# Patient Record
Sex: Male | Born: 1980 | Race: Black or African American | Hispanic: No | Marital: Single | Smoking: Current every day smoker
Health system: Southern US, Community
[De-identification: ages and names within clinical notes are randomized; demographics above are authoritative.]

## PROBLEM LIST (undated history)

## (undated) DIAGNOSIS — N419 Inflammatory disease of prostate, unspecified: Secondary | ICD-10-CM

## (undated) DIAGNOSIS — K4090 Unilateral inguinal hernia, without obstruction or gangrene, not specified as recurrent: Secondary | ICD-10-CM

## (undated) DIAGNOSIS — I10 Essential (primary) hypertension: Secondary | ICD-10-CM

## (undated) DIAGNOSIS — K219 Gastro-esophageal reflux disease without esophagitis: Secondary | ICD-10-CM

---
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
## 1999-02-02 ENCOUNTER — Encounter: Admission: RE | Admit: 1999-02-02 | Discharge: 1999-02-02 | Payer: Self-pay | Admitting: Internal Medicine

## 1999-02-18 ENCOUNTER — Ambulatory Visit (HOSPITAL_COMMUNITY): Admission: RE | Admit: 1999-02-18 | Discharge: 1999-02-18 | Payer: Self-pay | Admitting: General Surgery

## 2001-10-01 ENCOUNTER — Encounter: Payer: Self-pay | Admitting: Emergency Medicine

## 2001-10-01 ENCOUNTER — Emergency Department (HOSPITAL_COMMUNITY): Admission: EM | Admit: 2001-10-01 | Discharge: 2001-10-01 | Payer: Self-pay | Admitting: Emergency Medicine

## 2004-09-13 ENCOUNTER — Emergency Department (HOSPITAL_COMMUNITY): Admission: EM | Admit: 2004-09-13 | Discharge: 2004-09-13 | Payer: Self-pay | Admitting: Emergency Medicine

## 2004-09-15 ENCOUNTER — Emergency Department (HOSPITAL_COMMUNITY): Admission: EM | Admit: 2004-09-15 | Discharge: 2004-09-15 | Payer: Self-pay | Admitting: Emergency Medicine

## 2005-04-13 ENCOUNTER — Emergency Department (HOSPITAL_COMMUNITY): Admission: EM | Admit: 2005-04-13 | Discharge: 2005-04-13 | Payer: Self-pay | Admitting: Emergency Medicine

## 2005-11-02 ENCOUNTER — Ambulatory Visit (HOSPITAL_COMMUNITY): Admission: RE | Admit: 2005-11-02 | Discharge: 2005-11-02 | Payer: Self-pay | Admitting: Family Medicine

## 2005-11-02 ENCOUNTER — Emergency Department (HOSPITAL_COMMUNITY): Admission: EM | Admit: 2005-11-02 | Discharge: 2005-11-02 | Payer: Self-pay | Admitting: Family Medicine

## 2008-09-09 ENCOUNTER — Emergency Department (HOSPITAL_COMMUNITY): Admission: EM | Admit: 2008-09-09 | Discharge: 2008-09-09 | Payer: Self-pay | Admitting: Emergency Medicine

## 2008-09-12 ENCOUNTER — Encounter: Admission: RE | Admit: 2008-09-12 | Discharge: 2008-09-12 | Payer: Self-pay | Admitting: Chiropractic Medicine

## 2010-02-22 ENCOUNTER — Emergency Department (HOSPITAL_BASED_OUTPATIENT_CLINIC_OR_DEPARTMENT_OTHER): Admission: EM | Admit: 2010-02-22 | Discharge: 2010-02-22 | Payer: Self-pay | Admitting: Emergency Medicine

## 2010-09-19 ENCOUNTER — Emergency Department (HOSPITAL_COMMUNITY): Admission: EM | Admit: 2010-09-19 | Discharge: 2010-09-19 | Payer: Self-pay

## 2011-02-10 LAB — URINALYSIS, ROUTINE W REFLEX MICROSCOPIC
Ketones, ur: NEGATIVE mg/dL
Nitrite: NEGATIVE
Protein, ur: NEGATIVE mg/dL

## 2011-02-10 LAB — URINE MICROSCOPIC-ADD ON

## 2012-12-03 ENCOUNTER — Emergency Department (HOSPITAL_COMMUNITY): Payer: No Typology Code available for payment source

## 2012-12-03 ENCOUNTER — Emergency Department (HOSPITAL_COMMUNITY)
Admission: EM | Admit: 2012-12-03 | Discharge: 2012-12-04 | Disposition: A | Discharge status: Home / Self Care | Payer: No Typology Code available for payment source | Attending: Emergency Medicine | Admitting: Emergency Medicine

## 2012-12-03 ENCOUNTER — Encounter (HOSPITAL_COMMUNITY): Payer: Self-pay | Admitting: Emergency Medicine

## 2012-12-03 DIAGNOSIS — S8990XA Unspecified injury of unspecified lower leg, initial encounter: Secondary | ICD-10-CM | POA: Insufficient documentation

## 2012-12-03 DIAGNOSIS — F17200 Nicotine dependence, unspecified, uncomplicated: Secondary | ICD-10-CM | POA: Insufficient documentation

## 2012-12-03 DIAGNOSIS — S0990XA Unspecified injury of head, initial encounter: Secondary | ICD-10-CM | POA: Insufficient documentation

## 2012-12-03 DIAGNOSIS — R51 Headache: Secondary | ICD-10-CM | POA: Insufficient documentation

## 2012-12-03 DIAGNOSIS — S0993XA Unspecified injury of face, initial encounter: Secondary | ICD-10-CM | POA: Insufficient documentation

## 2012-12-03 DIAGNOSIS — S82401A Unspecified fracture of shaft of right fibula, initial encounter for closed fracture: Secondary | ICD-10-CM

## 2012-12-03 DIAGNOSIS — S82409A Unspecified fracture of shaft of unspecified fibula, initial encounter for closed fracture: Secondary | ICD-10-CM | POA: Insufficient documentation

## 2012-12-03 DIAGNOSIS — H5710 Ocular pain, unspecified eye: Secondary | ICD-10-CM | POA: Insufficient documentation

## 2012-12-03 DIAGNOSIS — S99919A Unspecified injury of unspecified ankle, initial encounter: Secondary | ICD-10-CM | POA: Insufficient documentation

## 2012-12-03 DIAGNOSIS — Y9389 Activity, other specified: Secondary | ICD-10-CM | POA: Insufficient documentation

## 2012-12-03 DIAGNOSIS — Y92410 Unspecified street and highway as the place of occurrence of the external cause: Secondary | ICD-10-CM | POA: Insufficient documentation

## 2012-12-03 MED ORDER — HYDROCODONE-ACETAMINOPHEN 5-325 MG PO TABS
1.0000 | ORAL_TABLET | Freq: Once | ORAL | Status: AC
  Administered 2012-12-03: 1 via ORAL
  Filled 2012-12-03: qty 1

## 2012-12-03 MED ORDER — IBUPROFEN 800 MG PO TABS: 800.0000 mg | ORAL_TABLET | Freq: Three times a day (TID) | ORAL | Status: DC

## 2012-12-03 MED ORDER — HYDROCODONE-ACETAMINOPHEN 5-325 MG PO TABS: 1.0000 | ORAL_TABLET | ORAL | Status: DC | PRN
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
## 2012-12-03 NOTE — ED Notes (Signed)
Bed:WA06<BR> Expected date:<BR> Expected time:<BR> Means of arrival:<BR> Comments:<BR> EMS
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
## 2012-12-04 NOTE — ED Provider Notes (Signed)
Medical screening examination/treatment/procedure(s) were performed by non-physician practitioner and as supervising physician I was immediately available for consultation/collaboration.  Layten Aiken, MD 12/04/12 0021 

## 2012-12-05 NOTE — ED Notes (Signed)
Pt called wanting work note from 12/03/12 ED visit.  Chart reviewed by C. Lawyer PA.  May give work note for 3 days w/f/u as ordered.  Pt informed and work note faxed to [employer] [phone] Attn Lawrence Santiago

## 2013-06-02 ENCOUNTER — Encounter (HOSPITAL_COMMUNITY): Payer: Self-pay | Admitting: Emergency Medicine

## 2013-06-02 ENCOUNTER — Emergency Department (HOSPITAL_COMMUNITY)
Admission: EM | Admit: 2013-06-02 | Discharge: 2013-06-02 | Disposition: A | Discharge status: Home / Self Care | Payer: Managed Care, Other (non HMO) | Attending: Emergency Medicine | Admitting: Emergency Medicine

## 2013-06-02 DIAGNOSIS — K089 Disorder of teeth and supporting structures, unspecified: Secondary | ICD-10-CM | POA: Insufficient documentation

## 2013-06-02 DIAGNOSIS — R51 Headache: Secondary | ICD-10-CM | POA: Insufficient documentation

## 2013-06-02 DIAGNOSIS — F17200 Nicotine dependence, unspecified, uncomplicated: Secondary | ICD-10-CM | POA: Insufficient documentation

## 2013-06-02 MED ORDER — SODIUM CHLORIDE 0.9 % IV BOLUS (SEPSIS)
1000.0000 mL | Freq: Once | INTRAVENOUS | Status: AC
  Administered 2013-06-02: 1000 mL via INTRAVENOUS

## 2013-06-02 MED ORDER — DIPHENHYDRAMINE HCL 50 MG/ML IJ SOLN
25.0000 mg | Freq: Once | INTRAMUSCULAR | Status: AC
  Administered 2013-06-02: 25 mg via INTRAVENOUS
  Filled 2013-06-02: qty 1

## 2013-06-02 MED ORDER — KETOROLAC TROMETHAMINE 30 MG/ML IJ SOLN
30.0000 mg | Freq: Once | INTRAMUSCULAR | Status: AC
  Administered 2013-06-02: 30 mg via INTRAVENOUS
  Filled 2013-06-02: qty 1

## 2013-06-02 MED ORDER — PROCHLORPERAZINE EDISYLATE 5 MG/ML IJ SOLN
10.0000 mg | Freq: Once | INTRAMUSCULAR | Status: AC
  Administered 2013-06-02: 10 mg via INTRAVENOUS
  Filled 2013-06-02: qty 2

## 2013-06-02 NOTE — ED Provider Notes (Signed)
Medical screening examination/treatment/procedure(s) were performed by non-physician practitioner and as supervising physician I was immediately available for consultation/collaboration.  Jones Skene, M.D.     Jones Skene, MD 06/02/13 [phone]

## 2013-06-02 NOTE — ED Notes (Signed)
Patient feeling restless, pacing in the room.  MD notified.  New orders per Dr Rulon Abide.

## 2013-06-02 NOTE — ED Notes (Signed)
Patient with headache since yesterday evening.  Patient denies any nausea or vomiting.  Patient states that he is photophobic and sensitive to sounds.  Patient has taken otc APAP with no relief.

## 2013-06-02 NOTE — ED Provider Notes (Signed)
History    CSN: [id number] Arrival date & time 06/02/13  0602  First MD Initiated Contact with Patient 06/02/13 [phone]     Chief Complaint  Patient presents with  . Headache   (Consider location/radiation/quality/duration/timing/severity/associated sxs/prior Treatment) HPI Comments: 32 y.o. Male with no significant PMHx presents today complaining of gradual onset frontal headache that started at work about 8pm last night. 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 States that headache worsened about 9pm at which time he took 3 Tylenol with no improvement. Describes the pain as 10/10, throbbing across his forehead. Constant. Worsening. Localized. Worsened with bright lights. Better when laying down with lights out. Pt took 3 more Tylenol at 11pm last night with no relief. Admits photophobia and right upper dental pain. Denies trauma, fever, nausea, vomiting, weakness, other visual disturbances, chest pain, shortness of breath, diaphoresis, cough, recent illness, neck stiffness.   Pt does not have a PCP, but does have [employer].   Patient is a 32 y.o. male presenting with headaches.  Headache Associated symptoms: photophobia   Associated symptoms: no diarrhea, no dizziness, no pain, no fever, no nausea, no neck pain, no neck stiffness, no numbness and no vomiting    History reviewed. No pertinent past medical history. Past Surgical History  Procedure Laterality Date  . Hernia repair  2000   No family history on file. History  Substance Use Topics  . Smoking status: Current Every Day Smoker -- 0.50 packs/day for 10 years    Types: Cigarettes  . Smokeless tobacco: Never Used  . Alcohol Use: Yes     Comment: occasionally    Review of Systems  Constitutional: Negative for fever and diaphoresis.  HENT: Positive for dental problem. Negative for rhinorrhea, neck pain and neck stiffness.        Upper right dental pain.   Eyes: Positive for photophobia. Negative for pain and visual  disturbance.  Respiratory: Negative for apnea, chest tightness and shortness of breath.   Cardiovascular: Negative for chest pain and palpitations.  Gastrointestinal: Negative for nausea, vomiting, diarrhea and constipation.  Genitourinary: Negative for dysuria.  Musculoskeletal: Negative for gait problem.  Skin: Negative for rash.  Neurological: Positive for headaches. Negative for dizziness, weakness, light-headedness and numbness.    Allergies  Review of patient's allergies indicates no known allergies.  Home Medications   Current Outpatient Rx  Name  Route  Sig  Dispense  Refill  . acetaminophen (TYLENOL) 500 MG tablet   Oral   Take 1,000 mg by mouth every 6 (six) hours as needed (headache).          BP 134/91  Pulse 62  Temp(Src) 98.1 F (36.7 C) (Oral)  Resp 15  SpO2 98% Physical Exam  Nursing note and vitals reviewed. Constitutional: He is oriented to person, place, and time. He appears well-developed and well-nourished. No distress.  uncomfortable  HENT:  Head: Normocephalic and atraumatic. No trismus in the jaw.  Right Ear: Tympanic membrane and external ear normal.  Left Ear: Tympanic membrane and external ear normal.  Mouth/Throat: Normal dentition. No dental abscesses or dental caries. No oropharyngeal exudate, posterior oropharyngeal edema, posterior oropharyngeal erythema or tonsillar abscesses.    Oropharynx is clear. Good dentition. Mild tenderness at teeth 1, 2, and 3 (pt states hurts in that general area, could not be specific as I touched each tooth). No erythema or edema of the underlying gingiva to suggest infection. Neck is nontender and supple without adenopathy or JVD.  Eyes: Conjunctivae  and EOM are normal. Pupils are equal, round, and reactive to light.  Neck: Normal range of motion. Neck supple.  No meningeal signs  Cardiovascular: Normal rate, regular rhythm and normal heart sounds.  Exam reveals no gallop and no friction rub.   No murmur  heard. Pulmonary/Chest: Effort normal and breath sounds normal. No respiratory distress. He has no wheezes. He has no rales. He exhibits no tenderness.  Abdominal: Soft. Bowel sounds are normal. He exhibits no distension. There is no tenderness. There is no rebound and no guarding.  Musculoskeletal: Normal range of motion. He exhibits no edema and no tenderness.  FROM to upper and lower extremities No step-offs noted on C-spine No tenderness to palpation of the spinous processes of the C-spine, T-spine or L-spine Full range of motion of C-spine, T-spine or L-spine Mild tenderness to palpation of the paraspinous muscles   Neurological: He is alert and oriented to person, place, and time. No cranial nerve deficit.  Speech is clear and goal oriented, follows commands Sensation normal to light touch and two point discrimination Moves extremities without ataxia, coordination intact Normal gait and balance Normal strength in upper and lower extremities bilaterally including dorsiflexion and plantar flexion, strong and equal grip strength   Skin: Skin is warm and dry. He is not diaphoretic. No erythema.  Psychiatric: He has a normal mood and affect.    ED Course  Procedures (including critical care time) Labs Reviewed - No data to display No results found. 1. Headache     MDM  Pt is afebrile and in NAD. Vitals WNL. Pt describes what sounds like a migraine HA. Normal neuro exam. FROM of neck and all extremities. Discussed case with Dr. Rulon Abide who agrees with trial of HA cocktail to include fluids, Toradol, benadryl, and compazine and re-evaluate. No lab work at this time.   Discussion included the fact that pt does not have a PCP, but does have [employer]. Encouraged him to establish a relationship with a PCP and also to have his dental pain looked at by a dentist. Will give both resources at discharge.   7:22 AM Pt feeling much better after headache cocktail and is ready for discharge.  Follow up to include as discussed above.  At this time there does not appear to be any evidence of an acute emergency medical condition and the patient appears stable for discharge with appropriate outpatient follow up. Diagnosis was discussed with patient who verbalizes understanding and is agreeable to discharge. Pt case discussed with Dr. Rulon Abide who agrees with plan.      Glade Nurse, PA-C 06/02/13 [phone]

## 2013-06-09 ENCOUNTER — Emergency Department (HOSPITAL_COMMUNITY)
Admission: EM | Admit: 2013-06-09 | Discharge: 2013-06-09 | Disposition: A | Discharge status: Home / Self Care | Payer: Managed Care, Other (non HMO) | Attending: Emergency Medicine | Admitting: Emergency Medicine

## 2013-06-09 ENCOUNTER — Encounter (HOSPITAL_COMMUNITY): Payer: Self-pay | Admitting: Emergency Medicine

## 2013-06-09 DIAGNOSIS — Z23 Encounter for immunization: Secondary | ICD-10-CM | POA: Insufficient documentation

## 2013-06-09 DIAGNOSIS — W268XXA Contact with other sharp object(s), not elsewhere classified, initial encounter: Secondary | ICD-10-CM | POA: Insufficient documentation

## 2013-06-09 DIAGNOSIS — Z79899 Other long term (current) drug therapy: Secondary | ICD-10-CM | POA: Insufficient documentation

## 2013-06-09 DIAGNOSIS — F17200 Nicotine dependence, unspecified, uncomplicated: Secondary | ICD-10-CM | POA: Insufficient documentation

## 2013-06-09 DIAGNOSIS — Y93E9 Activity, other interior property and clothing maintenance: Secondary | ICD-10-CM | POA: Insufficient documentation

## 2013-06-09 DIAGNOSIS — Y92009 Unspecified place in unspecified non-institutional (private) residence as the place of occurrence of the external cause: Secondary | ICD-10-CM | POA: Insufficient documentation

## 2013-06-09 DIAGNOSIS — S61219A Laceration without foreign body of unspecified finger without damage to nail, initial encounter: Secondary | ICD-10-CM

## 2013-06-09 DIAGNOSIS — S61209A Unspecified open wound of unspecified finger without damage to nail, initial encounter: Secondary | ICD-10-CM | POA: Insufficient documentation

## 2013-06-09 MED ORDER — TETANUS-DIPHTH-ACELL PERTUSSIS 5-2.5-18.5 LF-MCG/0.5 IM SUSP
0.5000 mL | Freq: Once | INTRAMUSCULAR | Status: AC
  Administered 2013-06-09: 0.5 mL via INTRAMUSCULAR
  Filled 2013-06-09: qty 0.5

## 2013-06-09 NOTE — ED Notes (Signed)
C/o approx 2 cm laceration to R 5th digit from "metal casing" approx 2-3 hours ago.  Unknown last DT.  Bandaid in place on arrival.

## 2013-06-09 NOTE — ED Provider Notes (Signed)
   History    CSN: [id number] Arrival date & time 06/09/13  0018  First MD Initiated Contact with Patient 06/09/13 0038     Chief Complaint  Patient presents with  . Extremity Laceration   HPI  History provided by the patient. Patient is 32 year old male with no significant PMH presenting with small laceration to the right fifth finger. Patient was cleaning around the oven and cut finger on a sharp piece of metal. Denies any weakness or numbness. Small amount of bleeding. No other aggravating or relieving factors. No other associated symptoms. Unsure of last tetanus.    History reviewed. No pertinent past medical history. Past Surgical History  Procedure Laterality Date  . Hernia repair  2000   No family history on file. History  Substance Use Topics  . Smoking status: Current Every Day Smoker -- 0.50 packs/day for 10 years    Types: Cigarettes  . Smokeless tobacco: Never Used  . Alcohol Use: Yes     Comment: occasionally    Review of Systems  Neurological: Negative for weakness and numbness.  All other systems reviewed and are negative.    Allergies  Review of patient's allergies indicates no known allergies.  Home Medications   Current Outpatient Rx  Name  Route  Sig  Dispense  Refill  . acetaminophen (TYLENOL) 500 MG tablet   Oral   Take 1,000 mg by mouth every 6 (six) hours as needed (headache).         . naproxen sodium (ANAPROX) 220 MG tablet   Oral   Take 220 mg by mouth 2 (two) times daily with a meal.          BP 131/80  Pulse 87  Temp(Src) 98.3 F (36.8 C) (Oral)  Resp 18  SpO2 98% Physical Exam  Nursing note and vitals reviewed. Constitutional: He is oriented to person, place, and time. He appears well-developed and well-nourished. No distress.  HENT:  Head: Normocephalic.  Cardiovascular: Normal rate and regular rhythm.   Pulmonary/Chest: Effort normal and breath sounds normal.  Musculoskeletal:  Small superficial semicircular  laceration to the distal right fifth finger. Normal sensation to light touch to the tip. Normal cap refill less than 2 seconds. Full ROM.  Neurological: He is alert and oriented to person, place, and time.  Skin: Skin is warm.  Psychiatric: He has a normal mood and affect. His behavior is normal.    ED Course  Procedures   1. Laceration of finger of right hand, initial encounter     MDM  Patient seen and evaluated. Patient well-appearing no acute distress. Tetanus updated.    Angus Seller, PA-C 06/09/13 [phone]

## 2013-06-09 NOTE — ED Provider Notes (Signed)
Medical screening examination/treatment/procedure(s) were performed by non-physician practitioner and as supervising physician I was immediately available for consultation/collaboration.   Iver Miklas, MD 06/09/13 0621 

## 2013-07-10 ENCOUNTER — Emergency Department (HOSPITAL_BASED_OUTPATIENT_CLINIC_OR_DEPARTMENT_OTHER)
Admission: EM | Admit: 2013-07-10 | Discharge: 2013-07-11 | Disposition: A | Discharge status: Home / Self Care | Payer: Managed Care, Other (non HMO) | Attending: Emergency Medicine | Admitting: Emergency Medicine

## 2013-07-10 ENCOUNTER — Encounter (HOSPITAL_BASED_OUTPATIENT_CLINIC_OR_DEPARTMENT_OTHER): Payer: Self-pay | Admitting: *Deleted

## 2013-07-10 DIAGNOSIS — F17200 Nicotine dependence, unspecified, uncomplicated: Secondary | ICD-10-CM | POA: Insufficient documentation

## 2013-07-10 DIAGNOSIS — N342 Other urethritis: Secondary | ICD-10-CM | POA: Insufficient documentation

## 2013-07-10 NOTE — ED Notes (Signed)
Pt c/o penile discharge  X 1 day

## 2013-07-11 MED ORDER — AZITHROMYCIN 1 G PO PACK
1.0000 g | PACK | Freq: Once | ORAL | Status: AC
  Administered 2013-07-11: 1 g via ORAL
  Filled 2013-07-11: qty 1

## 2013-07-11 MED ORDER — CEFTRIAXONE SODIUM 250 MG IJ SOLR
250.0000 mg | Freq: Once | INTRAMUSCULAR | Status: AC
  Administered 2013-07-11: 250 mg via INTRAMUSCULAR
  Filled 2013-07-11: qty 250
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
## 2013-07-12 LAB — GC/CHLAMYDIA PROBE AMP
CT Probe RNA: NEGATIVE
GC Probe RNA: NEGATIVE

## 2014-04-25 ENCOUNTER — Emergency Department (HOSPITAL_COMMUNITY)
Admission: EM | Admit: 2014-04-25 | Discharge: 2014-04-25 | Disposition: A | Discharge status: Home / Self Care | Payer: 59 | Attending: Emergency Medicine | Admitting: Emergency Medicine

## 2014-04-25 ENCOUNTER — Encounter (HOSPITAL_COMMUNITY): Payer: Self-pay | Admitting: Emergency Medicine

## 2014-04-25 DIAGNOSIS — Z79899 Other long term (current) drug therapy: Secondary | ICD-10-CM | POA: Insufficient documentation

## 2014-04-25 DIAGNOSIS — R509 Fever, unspecified: Secondary | ICD-10-CM | POA: Insufficient documentation

## 2014-04-25 DIAGNOSIS — F17200 Nicotine dependence, unspecified, uncomplicated: Secondary | ICD-10-CM | POA: Insufficient documentation

## 2014-04-25 DIAGNOSIS — R197 Diarrhea, unspecified: Secondary | ICD-10-CM | POA: Insufficient documentation

## 2014-04-25 LAB — COMPREHENSIVE METABOLIC PANEL
ALBUMIN: 3.8 g/dL (ref 3.5–5.2)
ALT: 23 U/L (ref 0–53)
AST: 29 U/L (ref 0–37)
Alkaline Phosphatase: 71 U/L (ref 39–117)
BUN: 8 mg/dL (ref 6–23)
CALCIUM: 9.4 mg/dL (ref 8.4–10.5)
CO2: 22 mEq/L (ref 19–32)
CREATININE: 0.92 mg/dL (ref 0.50–1.35)
Chloride: 101 mEq/L (ref 96–112)
GFR calc Af Amer: >90 mL/min (ref >90)
Glucose, Bld: 97 mg/dL (ref 70–99)
Potassium: 3.5 mEq/L — ABNORMAL LOW (ref 3.7–5.3)
Sodium: 137 mEq/L (ref 137–147)
Total Bilirubin: 0.5 mg/dL (ref 0.3–1.2)
Total Protein: 7.8 g/dL (ref 6.0–8.3)

## 2014-04-25 LAB — CLOSTRIDIUM DIFFICILE BY PCR: CDIFFPCR: NEGATIVE

## 2014-04-25 LAB — CBC
HEMATOCRIT: 39.9 % (ref 39.0–52.0)
Hemoglobin: 14.4 g/dL (ref 13.0–17.0)
MCH: 30.5 pg (ref 26.0–34.0)
MCHC: 36.1 g/dL — ABNORMAL HIGH (ref 30.0–36.0)
MCV: 84.5 fL (ref 78.0–100.0)
PLATELETS: 154 10*3/uL (ref 150–400)
RBC: 4.72 MIL/uL (ref 4.22–5.81)
RDW: 12.5 % (ref 11.5–15.5)
WBC: 4.8 10*3/uL (ref 4.0–10.5)

## 2014-04-25 MED ORDER — LOPERAMIDE HCL 2 MG PO CAPS: 2.0000 mg | ORAL_CAPSULE | Freq: Four times a day (QID) | ORAL | Status: DC | PRN

## 2014-04-25 MED ORDER — SODIUM CHLORIDE 0.9 % IV BOLUS (SEPSIS)
1000.0000 mL | Freq: Once | INTRAVENOUS | Status: AC
  Administered 2014-04-25: 1000 mL via INTRAVENOUS

## 2014-04-25 NOTE — ED Notes (Signed)
Pt to bathroom via w/c -- liquid greenish stool sent to lab,

## 2014-04-25 NOTE — ED Notes (Signed)
Pt presents with 1 week h/o intermittent fever.  Pt reports fever of 102 this morning at 0300.  Pt reports intermittent, generalized abdominal pain and diarrhea.  Pt denies nausea, vomiting, reports decreased appetite; pt denies any sick contact.

## 2014-04-25 NOTE — Discharge Instructions (Signed)
Take Imodium as prescribed as needed for diarrhea. Stay well-hydrated. You were tested today for bacteria in your stool, if this test results positive, you will be informed.  Diarrhea Diarrhea is frequent loose and watery bowel movements. It can cause you to feel weak and dehydrated. Dehydration can cause you to become tired and thirsty, have a dry mouth, and have decreased urination that often is dark yellow. Diarrhea is a sign of another problem, most often an infection that will not last long. In most cases, diarrhea typically lasts 2 3 days. However, it can last longer if it is a sign of something more serious. It is important to treat your diarrhea as directed by your caregive to lessen or prevent future episodes of diarrhea. CAUSES  Some common causes include:  Gastrointestinal infections caused by viruses, bacteria, or parasites.  Food poisoning or food allergies.  Certain medicines, such as antibiotics, chemotherapy, and laxatives.  Artificial sweeteners and fructose.  Digestive disorders. HOME CARE INSTRUCTIONS  Ensure adequate fluid intake (hydration): have 1 cup (8 oz) of fluid for each diarrhea episode. Avoid fluids that contain simple sugars or sports drinks, fruit juices, whole milk products, and sodas. Your urine should be clear or pale yellow if you are drinking enough fluids. Hydrate with an oral rehydration solution that you can purchase at pharmacies, retail stores, and online. You can prepare an oral rehydration solution at home by mixing the following ingredients together:    tsp table salt.   tsp baking soda.   tsp salt substitute containing potassium chloride.  1  tablespoons sugar.  1 L (34 oz) of water.  Certain foods and beverages may increase the speed at which food moves through the gastrointestinal (GI) tract. These foods and beverages should be avoided and include:  Caffeinated and alcoholic beverages.  High-fiber foods, such as raw fruits and  vegetables, nuts, seeds, and whole grain breads and cereals.  Foods and beverages sweetened with sugar alcohols, such as xylitol, sorbitol, and mannitol.  Some foods may be well tolerated and may help thicken stool including:  Starchy foods, such as rice, toast, pasta, low-sugar cereal, oatmeal, grits, baked potatoes, crackers, and bagels.  Bananas.  Applesauce.  Add probiotic-rich foods to help increase healthy bacteria in the GI tract, such as yogurt and fermented milk products.  Wash your hands well after each diarrhea episode.  Only take over-the-counter or prescription medicines as directed by your caregiver.  Take a warm bath to relieve any burning or pain from frequent diarrhea episodes. SEEK IMMEDIATE MEDICAL CARE IF:   You are unable to keep fluids down.  You have persistent vomiting.  You have blood in your stool, or your stools are black and tarry.  You do not urinate in 6 8 hours, or there is only a small amount of very dark urine.  You have abdominal pain that increases or localizes.  You have weakness, dizziness, confusion, or lightheadedness.  You have a severe headache.  Your diarrhea gets worse or does not get better.  You have a fever or persistent symptoms for more than 2 3 days.  You have a fever and your symptoms suddenly get worse. MAKE SURE YOU:   Understand these instructions.  Will watch your condition.  Will get help right away if you are not doing well or get worse. Document Released: 10/29/2002 Document Revised: 10/25/2012 Document Reviewed: 07/16/2012 [facility] Patient Information 2014 [place], [place].  Fever, Adult A fever is a higher than normal body temperature. In an  adult, an oral temperature around 98.6 F (37 C) is considered normal. A temperature of 100.4 F (38 C) or higher is generally considered a fever. Mild or moderate fevers generally have no long-term effects and often do not require treatment. Extreme fever (greater than  or equal to 106 F or 41.1 C) can cause seizures. The sweating that may occur with repeated or prolonged fever may cause dehydration. Elderly people can develop confusion during a fever. A measured temperature can vary with:  Age.  Time of day.  Method of measurement (mouth, underarm, rectal, or ear). The fever is confirmed by taking a temperature with a thermometer. Temperatures can be taken different ways. Some methods are accurate and some are not.  An oral temperature is used most commonly. Electronic thermometers are fast and accurate.  An ear temperature will only be accurate if the thermometer is positioned as recommended by the manufacturer.  A rectal temperature is accurate and done for those adults who have a condition where an oral temperature cannot be taken.  An underarm (axillary) temperature is not accurate and not recommended. Fever is a symptom, not a disease.  CAUSES   Infections commonly cause fever.  Some noninfectious causes for fever include:  Some arthritis conditions.  Some thyroid or adrenal gland conditions.  Some immune system conditions.  Some types of cancer.  A medicine reaction.  High doses of certain street drugs such as methamphetamine.  Dehydration.  Exposure to high outside or room temperatures.  Occasionally, the source of a fever cannot be determined. This is sometimes called a "fever of unknown origin" (FUO).  Some situations may lead to a temporary rise in body temperature that may go away on its own. Examples are:  Childbirth.  Surgery.  Intense exercise. HOME CARE INSTRUCTIONS   Take appropriate medicines for fever. Follow dosing instructions carefully. If you use acetaminophen to reduce the fever, be careful to avoid taking other medicines that also contain acetaminophen. Do not take aspirin for a fever if you are younger than age 39. There is an association with Reye's syndrome. Reye's syndrome is a rare but potentially  deadly disease.  If an infection is present and antibiotics have been prescribed, take them as directed. Finish them even if you start to feel better.  Rest as needed.  Maintain an adequate fluid intake. To prevent dehydration during an illness with prolonged or recurrent fever, you may need to drink extra fluid.Drink enough fluids to keep your urine clear or pale yellow.  Sponging or bathing with room temperature water may help reduce body temperature. Do not use ice water or alcohol sponge baths.  Dress comfortably, but do not over-bundle. SEEK MEDICAL CARE IF:   You are unable to keep fluids down.  You develop vomiting or diarrhea.  You are not feeling at least partly better after 3 days.  You develop new symptoms or problems. SEEK IMMEDIATE MEDICAL CARE IF:   You have shortness of breath or trouble breathing.  You develop excessive weakness.  You are dizzy or you faint.  You are extremely thirsty or you are making little or no urine.  You develop new pain that was not there before (such as in the head, neck, chest, back, or abdomen).  You have persistant vomiting and diarrhea for more than 1 to 2 days.  You develop a stiff neck or your eyes become sensitive to light.  You develop a skin rash.  You have a fever or persistent symptoms for more  than 2 to 3 days.  You have a fever and your symptoms suddenly get worse. MAKE SURE YOU:   Understand these instructions.  Will watch your condition.  Will get help right away if you are not doing well or get worse. Document Released: 05/04/2001 Document Revised: 01/31/2012 Document Reviewed: 09/09/2011 [facility] Patient Information 2014 [place].

## 2014-04-25 NOTE — ED Notes (Signed)
Pt states has been tired starting last week, then started having diarrhea and fever symptoms afterwards. Denies any risky behaviors- states had a fever at 3am that was 102.

## 2014-04-25 NOTE — ED Notes (Signed)
PA student at bedside.

## 2014-04-25 NOTE — ED Provider Notes (Signed)
Medical screening examination/treatment/procedure(s) were performed by non-physician practitioner and as supervising physician I was immediately available for consultation/collaboration.   EKG Interpretation None        Amandy Chubbuck N Denece Shearer, DO 04/25/14 1538 

## 2014-04-25 NOTE — ED Provider Notes (Signed)
CSN: [id number]     Arrival date & time 04/25/14  1149 History   First MD Initiated Contact with Patient 04/25/14 1202     No chief complaint on file.    (Consider location/radiation/quality/duration/timing/severity/associated sxs/prior Treatment) HPI Comments: 33 year old healthy male presents to the emergency department complaining of intermittent fever x1 week. Patient states he did not check his temperature until 2 days ago when he noticed he had a fever of 102. He has been taking over-the-counter cold and flu medicine with temporary relief of fever. This morning around 3:00 AM he had a temperature of 102, took the over-the-counter medication at that time, he has not had any medication since. Admits to associated diarrhea, he reports 1-2 episodes over the past 2 days. Stool is brown in color, not watery and does not have a foul odor. Denies abdominal pain, nausea or vomiting. He has a decreased appetite. States he has been in and out of the hospital with his mom who was released from the ICU one week ago and is now in a rehabilitation facility. Denies any recent antibiotic use.  The history is provided by the patient.    History reviewed. No pertinent past medical history. Past Surgical History  Procedure Laterality Date  . Hernia repair  2000   History reviewed. No pertinent family history. History  Substance Use Topics  . Smoking status: Current Every Day Smoker -- 0.50 packs/day for 10 years    Types: Cigarettes  . Smokeless tobacco: Never Used  . Alcohol Use: Yes     Comment: occasionally    Review of Systems  Constitutional: Positive for fever.  Gastrointestinal: Positive for diarrhea.  All other systems reviewed and are negative.     Allergies  Review of patient's allergies indicates no known allergies.  Home Medications   Prior to Admission medications   Medication Sig Start Date End Date Taking? Authorizing Provider  naproxen sodium (ANAPROX) 220 MG tablet Take  220 mg by mouth 2 (two) times daily with a meal.   Yes Historical Provider, MD  Nutritional Supplements (COLD AND FLU PO) Take 10 mLs by mouth 2 (two) times daily as needed (for cold symptoms).   Yes Historical Provider, MD  loperamide (IMODIUM) 2 MG capsule Take 1 capsule (2 mg total) by mouth 4 (four) times daily as needed for diarrhea or loose stools. 04/25/14   Trevor Mace, PA-C   BP 139/70  Pulse 73  Temp(Src) 98.5 F (36.9 C) (Oral)  Resp 12  Ht 6\' 4"  (1.93 m)  Wt 179 lb (81.194 kg)  BMI 21.80 kg/m2  SpO2 100% Physical Exam  Nursing note and vitals reviewed. Constitutional: He is oriented to person, place, and time. He appears well-developed and well-nourished. No distress.  HENT:  Head: Normocephalic and atraumatic.  Mouth/Throat: Oropharynx is clear and moist.  Eyes: Conjunctivae are normal. No scleral icterus.  Neck: Normal range of motion. Neck supple.  No meningeal signs.  Cardiovascular: Normal rate, regular rhythm, normal heart sounds and intact distal pulses.   Pulmonary/Chest: Effort normal and breath sounds normal.  Abdominal: Soft. Bowel sounds are normal. He exhibits no distension and no mass. There is no tenderness. There is no rebound and no guarding.  Musculoskeletal: Normal range of motion. He exhibits no edema.  Neurological: He is alert and oriented to person, place, and time.  Skin: Skin is warm and dry. He is not diaphoretic.  Psychiatric: He has a normal mood and affect. His behavior is normal.  ED Course  Procedures (including critical care time) Labs Review Labs Reviewed  CBC - Abnormal; Notable for the following:    MCHC 36.1 (*)    All other components within normal limits  COMPREHENSIVE METABOLIC PANEL - Abnormal; Notable for the following:    Potassium 3.5 (*)    All other components within normal limits  STOOL CULTURE  CLOSTRIDIUM DIFFICILE BY PCR    Imaging Review No results found.   EKG Interpretation None      MDM   Final  diagnoses:  Diarrhea  Fever    Patient presenting with fever and diarrhea. He is well appearing and in no apparent distress. Afebrile, vital signs stable. He has not had any medication since 3:00 this morning. His abdomen is soft and nontender. Plan to give IV fluids, check labs and stool culture if possible. 2:54 PM Her labs without any acute finding. C. difficile and stool culture pending. Patient is resting comfortably on exam bed. Abdomen remains soft and nontender. Stable for discharge. We'll discharge with Lomotil. Return precautions given. Patient states understanding of treatment care plan and is agreeable.  Trevor MaceRobyn M Albert, PA-C 04/25/14 1455

## 2014-04-27 ENCOUNTER — Encounter (HOSPITAL_COMMUNITY): Payer: Self-pay | Admitting: Emergency Medicine

## 2014-04-27 ENCOUNTER — Emergency Department (HOSPITAL_COMMUNITY): Payer: 59

## 2014-04-27 ENCOUNTER — Emergency Department (HOSPITAL_COMMUNITY)
Admission: EM | Admit: 2014-04-27 | Discharge: 2014-04-27 | Disposition: A | Discharge status: Home / Self Care | Payer: 59 | Attending: Emergency Medicine | Admitting: Emergency Medicine

## 2014-04-27 DIAGNOSIS — R059 Cough, unspecified: Secondary | ICD-10-CM | POA: Insufficient documentation

## 2014-04-27 DIAGNOSIS — N433 Hydrocele, unspecified: Secondary | ICD-10-CM | POA: Insufficient documentation

## 2014-04-27 DIAGNOSIS — R5383 Other fatigue: Secondary | ICD-10-CM

## 2014-04-27 DIAGNOSIS — Z791 Long term (current) use of non-steroidal anti-inflammatories (NSAID): Secondary | ICD-10-CM | POA: Insufficient documentation

## 2014-04-27 DIAGNOSIS — IMO0001 Reserved for inherently not codable concepts without codable children: Secondary | ICD-10-CM | POA: Insufficient documentation

## 2014-04-27 DIAGNOSIS — R42 Dizziness and giddiness: Secondary | ICD-10-CM | POA: Insufficient documentation

## 2014-04-27 DIAGNOSIS — R509 Fever, unspecified: Secondary | ICD-10-CM

## 2014-04-27 DIAGNOSIS — R05 Cough: Secondary | ICD-10-CM | POA: Insufficient documentation

## 2014-04-27 DIAGNOSIS — R5381 Other malaise: Secondary | ICD-10-CM | POA: Insufficient documentation

## 2014-04-27 LAB — CBC WITH DIFFERENTIAL/PLATELET
BASOS ABS: 0 10*3/uL (ref 0.0–0.1)
BASOS PCT: 1 % (ref 0–1)
EOS PCT: 1 % (ref 0–5)
Eosinophils Absolute: 0 10*3/uL (ref 0.0–0.7)
HEMATOCRIT: 38 % — AB (ref 39.0–52.0)
Hemoglobin: 13.3 g/dL (ref 13.0–17.0)
Lymphocytes Relative: 37 % (ref 12–46)
Lymphs Abs: 1.6 10*3/uL (ref 0.7–4.0)
MCH: 29.2 pg (ref 26.0–34.0)
MCHC: 35 g/dL (ref 30.0–36.0)
MCV: 83.3 fL (ref 78.0–100.0)
MONO ABS: 0.4 10*3/uL (ref 0.1–1.0)
Monocytes Relative: 10 % (ref 3–12)
Neutro Abs: 2.3 10*3/uL (ref 1.7–7.7)
Neutrophils Relative %: 53 % (ref 43–77)
Platelets: 146 10*3/uL — ABNORMAL LOW (ref 150–400)
RBC: 4.56 MIL/uL (ref 4.22–5.81)
RDW: 12.4 % (ref 11.5–15.5)
WBC: 4.4 10*3/uL (ref 4.0–10.5)

## 2014-04-27 LAB — COMPREHENSIVE METABOLIC PANEL
ALBUMIN: 3.6 g/dL (ref 3.5–5.2)
ALT: 24 U/L (ref 0–53)
AST: 33 U/L (ref 0–37)
Alkaline Phosphatase: 64 U/L (ref 39–117)
BUN: 9 mg/dL (ref 6–23)
CALCIUM: 9 mg/dL (ref 8.4–10.5)
CO2: 19 mEq/L (ref 19–32)
CREATININE: 0.91 mg/dL (ref 0.50–1.35)
Chloride: 97 mEq/L (ref 96–112)
GFR calc Af Amer: >90 mL/min (ref >90)
GFR calc non Af Amer: >90 mL/min (ref >90)
Glucose, Bld: 107 mg/dL — ABNORMAL HIGH (ref 70–99)
Potassium: 3.6 mEq/L — ABNORMAL LOW (ref 3.7–5.3)
Sodium: 133 mEq/L — ABNORMAL LOW (ref 137–147)
Total Bilirubin: 0.6 mg/dL (ref 0.3–1.2)
Total Protein: 7.4 g/dL (ref 6.0–8.3)

## 2014-04-27 LAB — URINALYSIS, ROUTINE W REFLEX MICROSCOPIC
GLUCOSE, UA: NEGATIVE mg/dL
HGB URINE DIPSTICK: NEGATIVE
Ketones, ur: 15 mg/dL — AB
Leukocytes, UA: NEGATIVE
Nitrite: NEGATIVE
Protein, ur: NEGATIVE mg/dL
Specific Gravity, Urine: 1.024 (ref 1.005–1.030)
UROBILINOGEN UA: 1 mg/dL (ref 0.0–1.0)
pH: 6 (ref 5.0–8.0)

## 2014-04-27 LAB — CK: CK TOTAL: 174 U/L (ref 7–232)

## 2014-04-27 MED ORDER — SODIUM CHLORIDE 0.9 % IV BOLUS (SEPSIS): 1000.0000 mL | Freq: Once | INTRAVENOUS | Status: DC

## 2014-04-27 MED ORDER — ACETAMINOPHEN 325 MG PO TABS
650.0000 mg | ORAL_TABLET | Freq: Once | ORAL | Status: AC
  Administered 2014-04-27: 650 mg via ORAL
  Filled 2014-04-27: qty 2

## 2014-04-27 MED ORDER — IBUPROFEN 600 MG PO TABS: 600.0000 mg | ORAL_TABLET | Freq: Four times a day (QID) | ORAL | Status: DC | PRN

## 2014-04-27 NOTE — ED Notes (Signed)
He states he was here Thursday for fevers and diarrhea. His symptoms have not improved. He tried a stool softener. He states he has not been eating well and last night he felt dizzy

## 2014-04-27 NOTE — Discharge Instructions (Signed)
We saw you in the ER for the fever. All the results in the ER are normal, labs and imaging. We are not sure what is causing your symptoms. The workup in the ER is not complete, and is limited to screening for life threatening and emergent conditions only, so please see a primary care doctor for further evaluation.  RESOURCE GUIDE  Chronic Pain Problems: Contact [facility]  [phone] Patients need to be referred by their primary care doctor.  Insufficient Money for Medicine: Contact [employer] Way:  call "211."   No Primary Care Doctor: - Call Health Connect  [phone] - can help you locate a primary care doctor that  accepts your insurance, provides certain services, etc. - Physician Referral Service- [phone]  Agencies that provide inexpensive medical care: - [facility]  [phone] - [facility]  [phone] - [facility]  [phone] - Women's Clinic  [phone] - Planned Parenthood  [phone] [facility]  [phone]  Medicaid-accepting [facility] Providers: - [facility]- [address]  [phone], Mon-Fri 9am-7pm, Sat 9am-1pm - [facility]- [address] [place], [address] [place]  [phone] - [facility]- [address], [address] [place]  [phone] [facility]- [address]  [phone] - Renaye Rakers- [address] [place], [address], [phone]  Only accepts [place] patients after they have their name  applied to their card  Self Pay (no insurance) in [place]: - Sickle Cell Patients: Dr Willey Blade, [facility]  [address] [place], [phone] - [facility]- [address] [place]  [phone]       [facility] [place]- [address]       -     [facility]- see information above (Speak to [employer] if you do not have insurance)       -   [facility]- [address],  [phone]       -  [facility]- [address], [phone]       -  Dr Julio Sicks-  [address], [place], [phone]       -  [facility] - [address] [phone]       -  [facility]- [address], [phone], also [address], [phone]       -    [facility]- [address] [place], [phone], 1st & 3rd Saturday        every month, 10am-1pm  [facility] [address] [place], [place] [address] [phone]  [facility] [address] [place], [place] [address] [phone]  1) Find a Doctor and Pay Out of Pocket Although you won't have to find out who is covered by your insurance plan, it is a good idea to ask around and get recommendations. You will then need to call the office and see if the doctor you have chosen will accept you as a new patient and what types of options they offer for patients who are self-pay. Some doctors offer discounts or will set up payment plans for their patients who do not have insurance, but you will need to ask so you aren't surprised when you get  to your appointment.  2) Contact Your Local Health Department Not all health departments have doctors that can see patients for sick visits, but many do, so it is worth a call to see if yours does. If you don't know where your local health department is, you can check in your phone book. The CDC also has a tool to help you locate your state's health department, and many state websites also have listings of all of their local health departments.  3) Find a Walk-in Clinic If your illness is not likely to be very severe or complicated, you may want to try a walk in clinic. These are popping up all over the country in pharmacies, drugstores, and shopping centers. They're usually staffed by nurse practitioners or physician assistants that have been  trained to treat common illnesses and complaints. They're usually fairly quick and inexpensive. However, if you have serious medical issues or chronic medical problems, these are probably not your best option  STD Testing - [facility] [facility] [place], STD Clinic, [address], [place], phone [phone] or [phone].  Monday - Friday, call for an appointment. [facility]- [place] [facility] of [facility] [place], STD Clinic, [place], [place], phone [phone] or [phone].  Monday - Friday, call for an appointment.  Abuse/Neglect: [facility]- [place] Child Abuse Hotline [phone]) 8724533952 [facility]- [place] Child Abuse Hotline 775 660 [phone] (After Hours)  Emergency Shelter:  [facility] [phone]  Maternity Homes: - [facility] [place] [facility] [phone] - [facility] [phone]  MRSA Hotline #:   (952)568-7147(706)887-9155  Dental Assistance If unable to pay or uninsured, contact:  [facility] to become qualified for the adult dental clinic.  Patients with Medicaid: [facility] [phone], [phone] [address], [phone]  If unable to pay, or uninsured, contact [facility] [phone] in [place], [phone] in [facility]) to become qualified for the adult dental clinic  [facility] [address] [place], [place] [address] [phone] [email or web address]  Other [facility] Community Dental Services: - [facility]- [address], [place], [place], [address], [phone], [address], 2nd and 4th Thursday of the month at 6:30am.  10 clients each day by appointment, can sometimes see walk-in patients if someone does not show for an appointment. [facility]- [facility]- [address], [place], [place], [address], [phone] - [facility]- [address], [place], [place], [address], [phone] [facility]- [place]  [facility]- [phone] [facility]- [place] [facility]- [phone] [facility] [place] [phone]           Fever, Adult A fever is a higher than normal body temperature. In an adult, an oral temperature around 98.6 F (37 C) is considered normal. A temperature of 100.4 F (38 C) or higher is generally considered a fever. Mild or moderate fevers generally have no long-term effects and often do not require treatment. Extreme fever (greater than or equal to 106 F or 41.1 C) can cause seizures. The sweating that may occur with repeated or prolonged fever may cause dehydration. Elderly people can develop confusion during a fever. A measured temperature can vary with:  Age.  Time of day.  Method of measurement (mouth, underarm, rectal, or ear). The fever is confirmed by taking a temperature with a thermometer. Temperatures can be taken different ways. Some methods are accurate and some are not.  An oral temperature is used most commonly. Electronic thermometers are fast and accurate.  An ear temperature will only be accurate if the thermometer is positioned as recommended by the manufacturer.  A rectal temperature is accurate and done for those adults who have a condition where an oral temperature cannot be taken.  An underarm (axillary) temperature is not accurate and not recommended. Fever is a symptom, not a disease.  CAUSES   Infections commonly cause fever.  Some noninfectious causes for fever include:  Some arthritis conditions.  Some thyroid or adrenal gland conditions.  Some immune system conditions.  Some types of cancer.  A medicine reaction.  High doses of certain street drugs such as methamphetamine.  Dehydration.  Exposure to high outside or room temperatures.  Occasionally, the source of a fever cannot be determined. This is sometimes called a "fever of unknown origin" (FUO).  Some situations may lead to a temporary rise in  body temperature that may go away on its own. Examples are:  Childbirth.  Surgery.  Intense exercise. HOME CARE INSTRUCTIONS   Take appropriate medicines for fever. Follow dosing instructions carefully. If you use acetaminophen to reduce the fever, be careful to avoid taking other medicines that also contain acetaminophen. Do not take aspirin for a fever if you are younger than age 74. There is an association with Reye's syndrome. Reye's syndrome is a rare but potentially deadly disease.  If an infection is present and antibiotics have been prescribed, take them as directed. Finish them even if you start to feel better.  Rest as needed.  Maintain an adequate fluid intake. To prevent dehydration during an illness with prolonged or recurrent fever, you may need to drink extra fluid.Drink enough fluids to keep your urine clear or pale yellow.  Sponging or bathing with room temperature water may help reduce body temperature. Do not use ice water or alcohol sponge baths.  Dress comfortably, but do not over-bundle. SEEK MEDICAL CARE IF:   You are unable to keep fluids down.  You develop vomiting or diarrhea.  You are not feeling at least partly better after 3 days.  You develop new symptoms or problems. SEEK IMMEDIATE MEDICAL CARE IF:   You have shortness of breath or trouble breathing.  You develop excessive weakness.  You are dizzy or you faint.  You are extremely thirsty or you are making little or no urine.  You develop new pain that was not there before (such as in the head, neck, chest, back, or abdomen).  You have persistant vomiting and diarrhea for more than 1 to 2 days.  You develop a stiff neck or your eyes become sensitive to light.  You develop a skin rash.  You have a fever or persistent symptoms for more than 2 to 3 days.  You have a fever and your symptoms suddenly get worse. MAKE SURE YOU:   Understand these instructions.  Will watch your  condition.  Will get help right away if you are not doing well or get worse. Document Released: 05/04/2001 Document Revised: 01/31/2012 Document Reviewed: 09/09/2011 [facility] Patient Information 2014 [place], [place].

## 2014-04-27 NOTE — ED Provider Notes (Signed)
CSN: [id number]     Arrival date & time 04/27/14  0919 History   First MD Initiated Contact with Patient 04/27/14 [phone]     Chief Complaint  Patient presents with  . Fever     (Consider location/radiation/quality/duration/timing/severity/associated sxs/prior Treatment) HPI Comments: Pt comes in with cc of fevers. Pt has no medical hx. States that he has been busy taking care of his mother for the past few weeks, and started noticing that he has been increasingly getting tired the last few days. Pt thereafter noticed that he has been having fevers - as high as 102. He was recently seen in the ER, for fevers and diarrhea, discharged with imodium. Pt reports + cough, dry intermittent, no night sweats, no nausea, emesis, chest pains, shortness of breath, headaches, abdominal pain, uti like symptoms, rashes. Pt has no joint swelling, leg swelling, PE or DVT hx or risk factors. Pt has had 1 loose BM since last night. ROS is + for dizziness with standing. No ivda hx. No sick contacts.    Patient is a 33 y.o. male presenting with fever. The history is provided by the patient.  Fever Associated symptoms: myalgias   Associated symptoms: no chest pain, no chills, no confusion, no cough, no dysuria and no headaches     History reviewed. No pertinent past medical history. Past Surgical History  Procedure Laterality Date  . Hernia repair  2000   History reviewed. No pertinent family history. History  Substance Use Topics  . Smoking status: Current Every Day Smoker -- 0.50 packs/day for 10 years    Types: Cigarettes  . Smokeless tobacco: Never Used  . Alcohol Use: Yes     Comment: occasionally    Review of Systems  Constitutional: Positive for fever and fatigue. Negative for chills and activity change.  Eyes: Negative for visual disturbance.  Respiratory: Negative for cough, chest tightness and shortness of breath.   Cardiovascular: Negative for chest pain.  Gastrointestinal: Negative for  abdominal distention.  Genitourinary: Negative for dysuria, enuresis and difficulty urinating.  Musculoskeletal: Positive for myalgias. Negative for arthralgias and neck pain.  Neurological: Positive for dizziness and weakness. Negative for light-headedness and headaches.  Psychiatric/Behavioral: Negative for confusion.      Allergies  Review of patient's allergies indicates no known allergies.  Home Medications   Prior to Admission medications   Medication Sig Start Date End Date Taking? Authorizing Provider  ibuprofen (ADVIL,MOTRIN) 200 MG tablet Take 200-400 mg by mouth every 6 (six) hours as needed for fever, headache, mild pain, moderate pain or cramping.   Yes Historical Provider, MD  loperamide (IMODIUM) 2 MG capsule Take 1 capsule (2 mg total) by mouth 4 (four) times daily as needed for diarrhea or loose stools. 04/25/14  Yes Trevor Mace, PA-C  naproxen sodium (ANAPROX) 220 MG tablet Take 220 mg by mouth 2 (two) times daily with a meal.   Yes Historical Provider, MD  Nutritional Supplements (COLD AND FLU PO) Take 10 mLs by mouth 2 (two) times daily as needed (for cold symptoms).   Yes Historical Provider, MD  ibuprofen (ADVIL,MOTRIN) 600 MG tablet Take 1 tablet (600 mg total) by mouth every 6 (six) hours as needed for fever. 04/27/14   Macklen Wilhoite Rhunette Croft, MD   BP 113/55  Pulse 67  Temp(Src) 99.1 F (37.3 C) (Oral)  Resp 18  Ht 6\' 4"  (1.93 m)  Wt 278 lb (126.1 kg)  BMI 33.85 kg/m2  SpO2 100% Physical Exam  Nursing note and  vitals reviewed. Constitutional: He is oriented to person, place, and time. He appears well-developed.  HENT:  Head: Normocephalic and atraumatic.  Eyes: Conjunctivae and EOM are normal. Pupils are equal, round, and reactive to light.  Neck: Normal range of motion. Neck supple.  Cardiovascular: Normal rate and regular rhythm.   Pulmonary/Chest: Effort normal and breath sounds normal.  Abdominal: Soft. Bowel sounds are normal. He exhibits no distension.  There is no tenderness. There is no rebound and no guarding.  Neurological: He is alert and oriented to person, place, and time.  Skin: Skin is warm.    ED Course  Procedures (including critical care time) Labs Review Labs Reviewed  CBC WITH DIFFERENTIAL - Abnormal; Notable for the following:    HCT 38.0 (*)    Platelets 146 (*)    All other components within normal limits  COMPREHENSIVE METABOLIC PANEL - Abnormal; Notable for the following:    Sodium 133 (*)    Potassium 3.6 (*)    Glucose, Bld 107 (*)    All other components within normal limits  URINALYSIS, ROUTINE W REFLEX MICROSCOPIC - Abnormal; Notable for the following:    Color, Urine AMBER (*)    APPearance HAZY (*)    Bilirubin Urine SMALL (*)    Ketones, ur 15 (*)    All other components within normal limits  CK    Imaging Review Dg Chest 2 View  04/27/2014   CLINICAL DATA:  Two week history of intermittent fever  EXAM: CHEST  2 VIEW  COMPARISON:  Prior radiographs of the thoracic spine including a frontal view of the chest 09/12/2008  FINDINGS: The lungs are clear and negative for focal airspace consolidation, pulmonary edema or suspicious pulmonary nodule. No pleural effusion or pneumothorax. Cardiac and mediastinal contours are within normal limits. No acute fracture or lytic or blastic osseous lesions. The visualized upper abdominal bowel gas pattern is unremarkable.  IMPRESSION: No active cardiopulmonary disease.   Electronically Signed   By: Malachy Moan M.D.   On: 04/27/2014 12:11   US Scrotum  04/27/2014   CLINICAL DATA:  Right-sided testicular pain.  EXAM: ULTRASOUND OF SCROTUM  TECHNIQUE: Complete ultrasound examination of the testicles, epididymis, and other scrotal structures was performed.  COMPARISON:  None.  FINDINGS: Right testicle  Measurements: 4.3 x 2.6 x 3.2 cm. No mass or microlithiasis visualized.  Left testicle  Measurements: 4.2 x 2.6 x 3.2 cm. No mass or microlithiasis visualized.  Right  epididymis: 2 x 2 x 2 mm anechoic lesion with increased through transmission in the right epididymis may represent a tiny epididymal cyst or spermatocele.  Left epididymis:  Normal in size and appearance.  Hydrocele: Moderate right-sided hydrocele. Small left-sided hydrocele.  Varicocele:  None visualized.  IMPRESSION: 1. Moderate right-sided hydrocele. 2. Small left hydrocele. 3. No evidence of testicular torsion or epididymo-orchitis. 4. Tiny 2 mm epididymal cyst or spermatocele on the right epididymis incidentally noted.   Electronically Signed   By: Trudie Reed M.D.   On: 04/27/2014 14:13   [place] Art/ven Flow Abd Pelv Doppler  04/27/2014   CLINICAL DATA:  Right-sided testicular pain.  EXAM: ULTRASOUND OF SCROTUM  TECHNIQUE: Complete ultrasound examination of the testicles, epididymis, and other scrotal structures was performed.  COMPARISON:  None.  FINDINGS: Right testicle  Measurements: 4.3 x 2.6 x 3.2 cm. No mass or microlithiasis visualized.  Left testicle  Measurements: 4.2 x 2.6 x 3.2 cm. No mass or microlithiasis visualized.  Right epididymis: 2 x 2  x 2 mm anechoic lesion with increased through transmission in the right epididymis may represent a tiny epididymal cyst or spermatocele.  Left epididymis:  Normal in size and appearance.  Hydrocele: Moderate right-sided hydrocele. Small left-sided hydrocele.  Varicocele:  None visualized.  IMPRESSION: 1. Moderate right-sided hydrocele. 2. Small left hydrocele. 3. No evidence of testicular torsion or epididymo-orchitis. 4. Tiny 2 mm epididymal cyst or spermatocele on the right epididymis incidentally noted.   Electronically Signed   By: Trudie Reedaniel  Entrikin M.D.   On: 04/27/2014 14:13     EKG Interpretation None      MDM   Final diagnoses:  Fever  Hydrocele, left    Pt comes in with cc of fevers. Fevers doe few days now. Occasional loose BM - but certainly no diarrhea. Pt on exam has no source of infection, and hx not suggestive of any source  either. No hx of cancer. No other SIRs criteria.  Pt was concerned about his scrotal pain, intermittent, with warmth in that area. US ordered - neg. He has no penile d/c. UA is clear.  Advised appropriate f/u with pcp for optimal care, for fever of unknown origin.  Derwood KaplanAnkit Ian Castagna, MD 04/27/14 1430

## 2014-04-27 NOTE — ED Notes (Addendum)
Pt states he noticed his scrotum is extremely hot.  Pt states he is having some pains in his R scrotal area.  Pt states he had hernia surgery about 10 years ago and wonders if that is where his infection is coming from.

## 2014-04-29 ENCOUNTER — Telehealth (HOSPITAL_COMMUNITY): Payer: Self-pay

## 2014-04-29 LAB — STOOL CULTURE

## 2014-05-12 ENCOUNTER — Telehealth (INDEPENDENT_AMBULATORY_CARE_PROVIDER_SITE_OTHER): Payer: Self-pay | Admitting: Surgery

## 2014-05-12 NOTE — Telephone Encounter (Signed)
Bruce DingsRayshunn T Clingan  03/09/1981 [id number]  Patient Care Team: Provider Not In System as PCP - General  This patient is a 33 y.o.male who calls today for surgical evaluation.   Date of procedure/visit: ?  Surgery: ?  Reason for call: Blood in ostomy bag  Patient's mother called.  This was while I was in the operating room.  They claim pt is a patient of Dr. Carman Chingoug Blackman.  Apparently underwent some separate procedure.  Came home.  Noted blood on ostomy bag.  They were concerned.  No continued bleeding.  No nausea or vomiting.  Tolerating PO  Recommendation was made for ice pack on area for 30 minutes.  Gentle pressure.  Bleeding should stop.  If bleeding continues or is uncontrolled, consider evaluation in emergency department or office.  Most likely mild irritation.  They felt reassured.  There are no active problems to display for this patient.   No past medical history on file.  Past Surgical History  Procedure Laterality Date  . Hernia repair  2000    History   Social History  . Marital Status: Single    Spouse Name: N/A    Number of Children: N/A  . Years of Education: N/A   Occupational History  . Not on file.   Social History Main Topics  . Smoking status: Current Every Day Smoker -- 0.50 packs/day for 10 years    Types: Cigarettes  . Smokeless tobacco: Never Used  . Alcohol Use: Yes     Comment: occasionally  . Drug Use: No  . Sexual Activity:    Other Topics Concern  . Not on file   Social History Narrative  . No narrative on file    No family history on file.  Current Outpatient Prescriptions  Medication Sig Dispense Refill  . ibuprofen (ADVIL,MOTRIN) 200 MG tablet Take 200-400 mg by mouth every 6 (six) hours as needed for fever, headache, mild pain, moderate pain or cramping.      Marland Kitchen. ibuprofen (ADVIL,MOTRIN) 600 MG tablet Take 1 tablet (600 mg total) by mouth every 6 (six) hours as needed for fever.  30 tablet  0  . loperamide (IMODIUM) 2 MG capsule  Take 1 capsule (2 mg total) by mouth 4 (four) times daily as needed for diarrhea or loose stools.  12 capsule  0  . naproxen sodium (ANAPROX) 220 MG tablet Take 220 mg by mouth 2 (two) times daily with a meal.      . Nutritional Supplements (COLD AND FLU PO) Take 10 mLs by mouth 2 (two) times daily as needed (for cold symptoms).       No current facility-administered medications for this visit.     No Known Allergies  @VS @  Dg Chest 2 View  04/27/2014   CLINICAL DATA:  Two week history of intermittent fever  EXAM: CHEST  2 VIEW  COMPARISON:  Prior radiographs of the thoracic spine including a frontal view of the chest 09/12/2008  FINDINGS: The lungs are clear and negative for focal airspace consolidation, pulmonary edema or suspicious pulmonary nodule. No pleural effusion or pneumothorax. Cardiac and mediastinal contours are within normal limits. No acute fracture or lytic or blastic osseous lesions. The visualized upper abdominal bowel gas pattern is unremarkable.  IMPRESSION: No active cardiopulmonary disease.   Electronically Signed   By: Malachy MoanHeath  McCullough M.D.   On: 04/27/2014 12:11   [place] Scrotum  04/27/2014   CLINICAL DATA:  Right-sided testicular pain.  EXAM: ULTRASOUND OF SCROTUM  TECHNIQUE: Complete ultrasound examination of the testicles, epididymis, and other scrotal structures was performed.  COMPARISON:  None.  FINDINGS: Right testicle  Measurements: 4.3 x 2.6 x 3.2 cm. No mass or microlithiasis visualized.  Left testicle  Measurements: 4.2 x 2.6 x 3.2 cm. No mass or microlithiasis visualized.  Right epididymis: 2 x 2 x 2 mm anechoic lesion with increased through transmission in the right epididymis may represent a tiny epididymal cyst or spermatocele.  Left epididymis:  Normal in size and appearance.  Hydrocele: Moderate right-sided hydrocele. Small left-sided hydrocele.  Varicocele:  None visualized.  IMPRESSION: 1. Moderate right-sided hydrocele. 2. Small left hydrocele. 3. No evidence of  testicular torsion or epididymo-orchitis. 4. Tiny 2 mm epididymal cyst or spermatocele on the right epididymis incidentally noted.   Electronically Signed   By: Trudie Reedaniel  Entrikin M.D.   On: 04/27/2014 14:13   [place] Art/ven Flow Abd Pelv Doppler  04/27/2014   CLINICAL DATA:  Right-sided testicular pain.  EXAM: ULTRASOUND OF SCROTUM  TECHNIQUE: Complete ultrasound examination of the testicles, epididymis, and other scrotal structures was performed.  COMPARISON:  None.  FINDINGS: Right testicle  Measurements: 4.3 x 2.6 x 3.2 cm. No mass or microlithiasis visualized.  Left testicle  Measurements: 4.2 x 2.6 x 3.2 cm. No mass or microlithiasis visualized.  Right epididymis: 2 x 2 x 2 mm anechoic lesion with increased through transmission in the right epididymis may represent a tiny epididymal cyst or spermatocele.  Left epididymis:  Normal in size and appearance.  Hydrocele: Moderate right-sided hydrocele. Small left-sided hydrocele.  Varicocele:  None visualized.  IMPRESSION: 1. Moderate right-sided hydrocele. 2. Small left hydrocele. 3. No evidence of testicular torsion or epididymo-orchitis. 4. Tiny 2 mm epididymal cyst or spermatocele on the right epididymis incidentally noted.   Electronically Signed   By: Trudie Reedaniel  Entrikin M.D.   On: 04/27/2014 14:13    Note: This dictation was prepared with Dragon/digital dictation along with [employer] technology. Any transcriptional errors that result from this process are unintentional.

## 2014-07-19 IMAGING — US US SCROTUM
1 series · 14 of 25 positions shown · non-contrast
Comparison: None.

CLINICAL DATA: Right-sided testicular pain.

EXAM:
ULTRASOUND OF SCROTUM
TECHNIQUE: Complete ultrasound examination of the testicles, epididymis, and
other scrotal structures was performed.

[Series 1: us scrotum · 0.06mm/px · 14 of 64 slices shown]
[im 1/64]
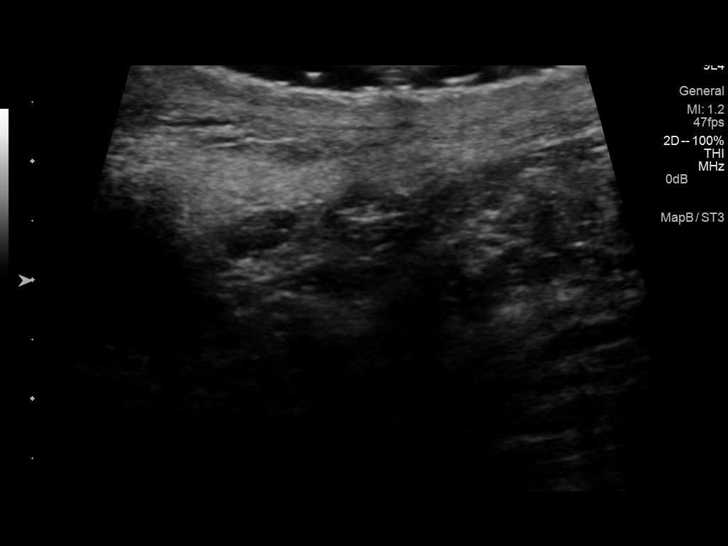
[im 6/64]
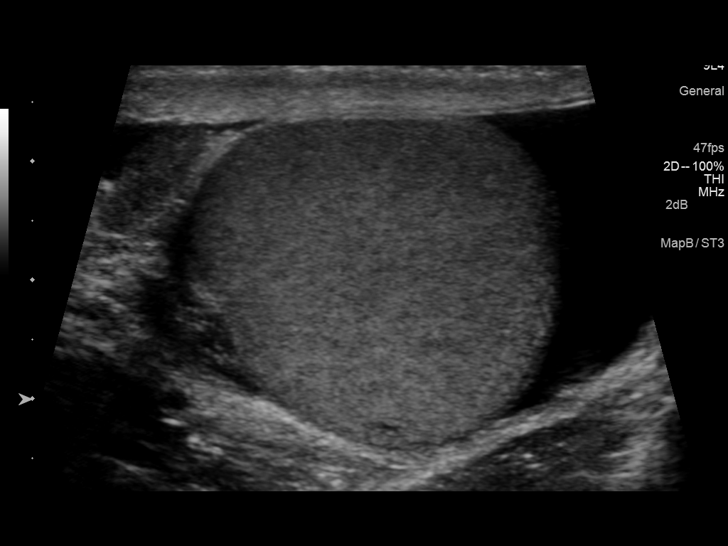
[im 11/64]
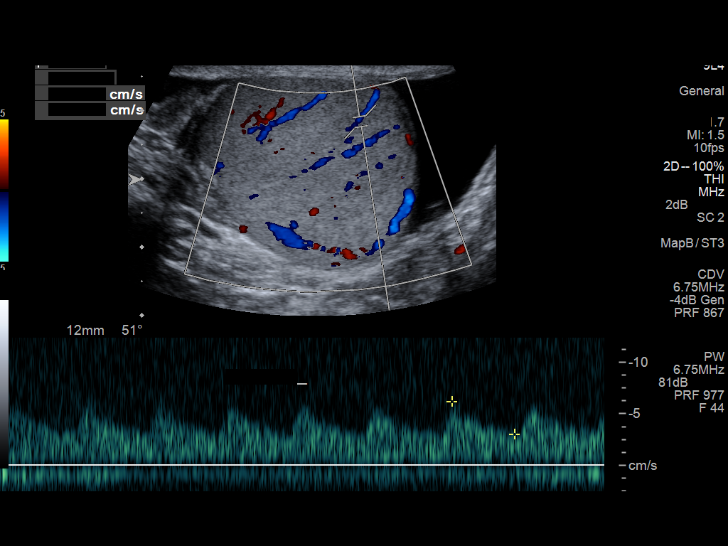
[im 16/64]
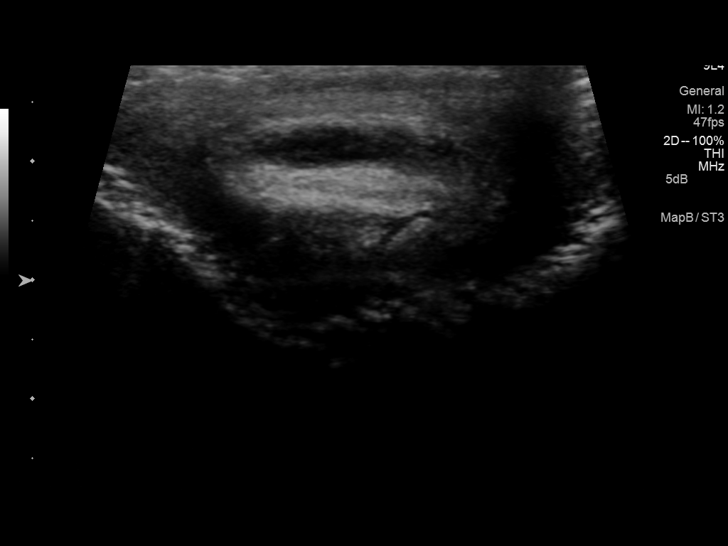
[im 22/64]
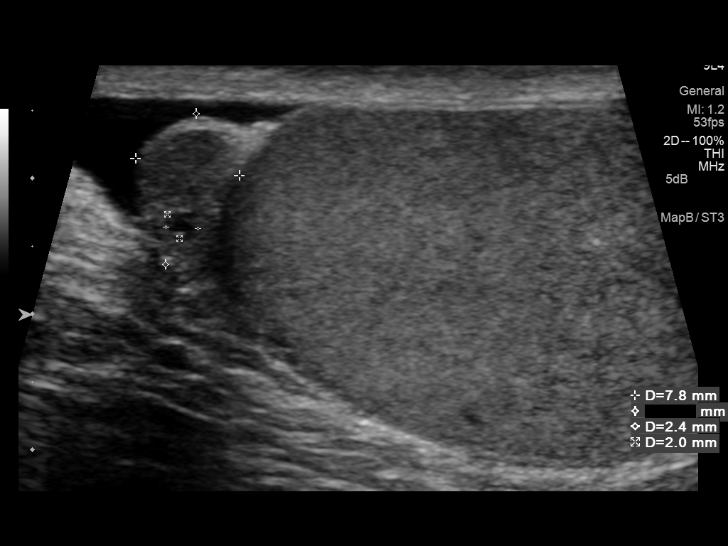
[im 24/64]
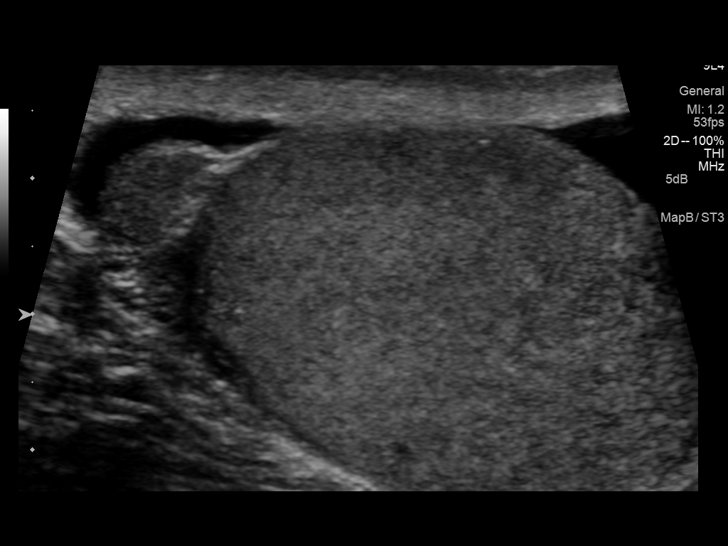
[im 29/64]
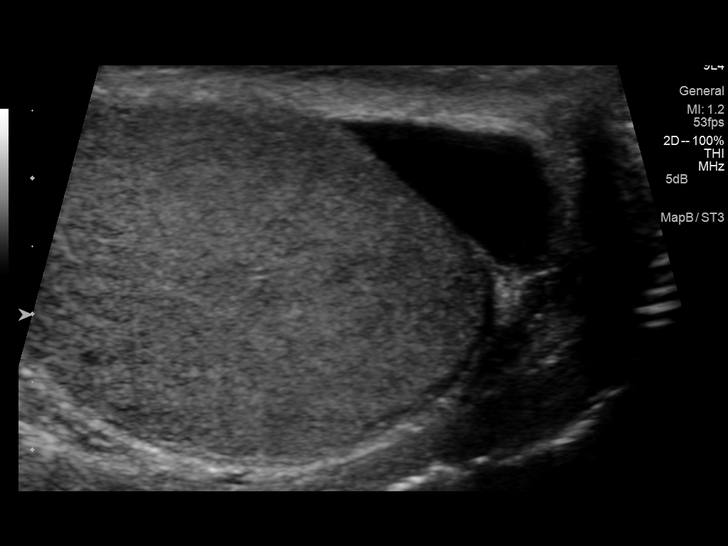
[im 35/64]
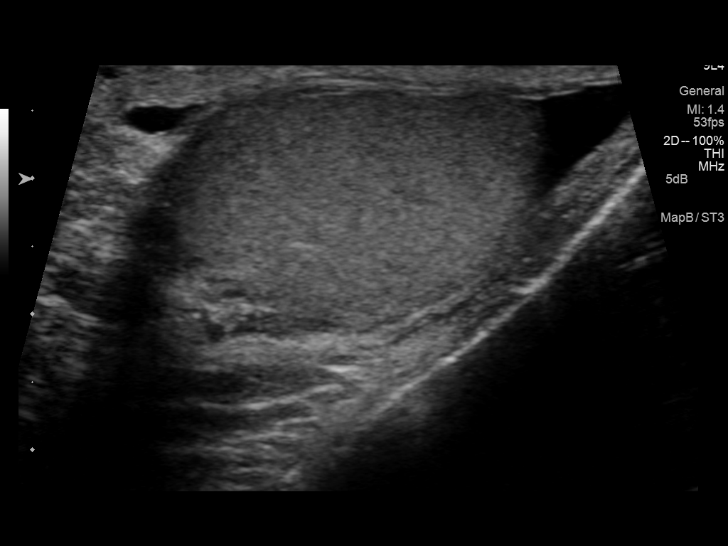
[im 40/64]
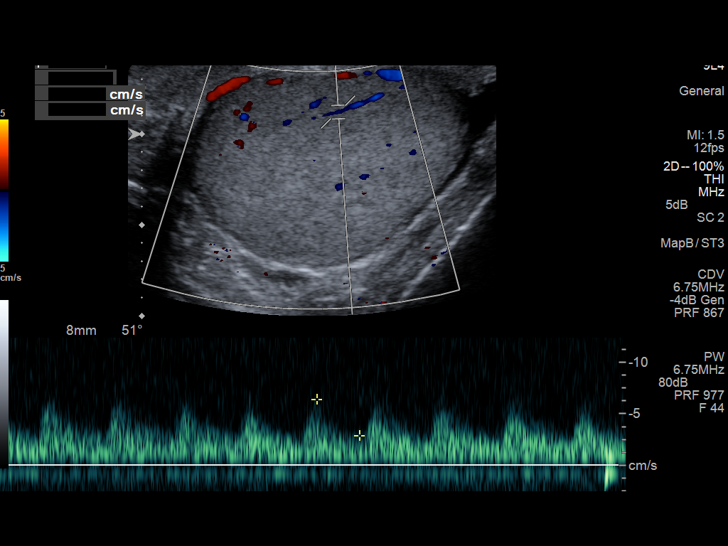
[im 43/64]
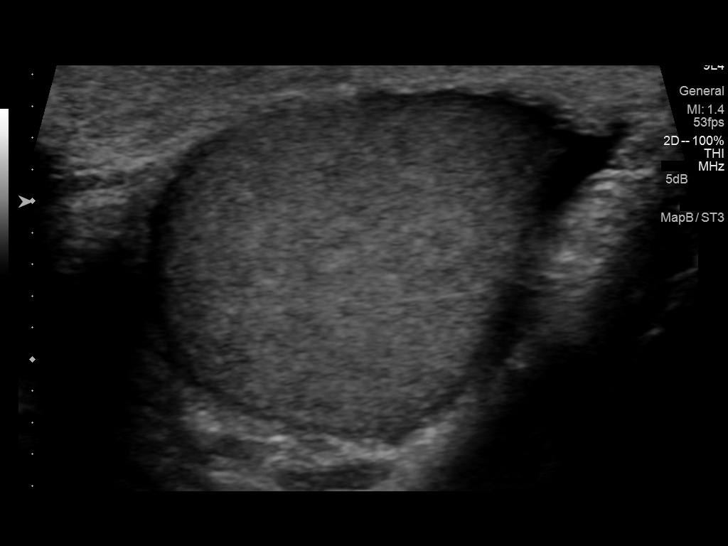
[im 48/64]
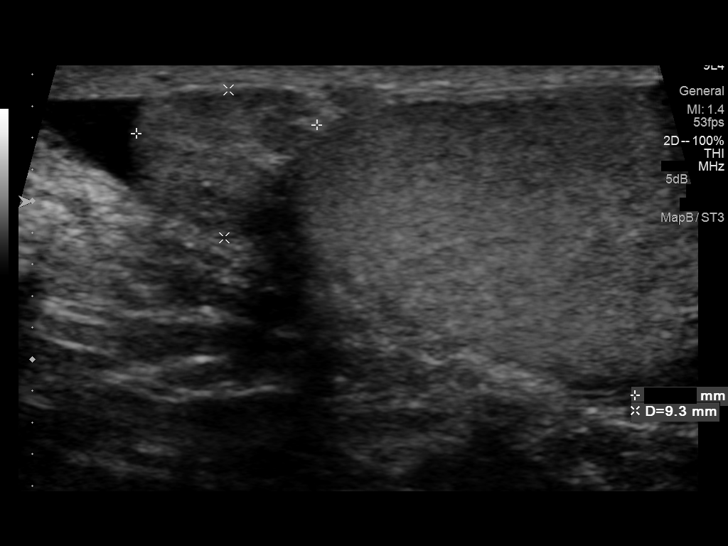
[im 53/64]
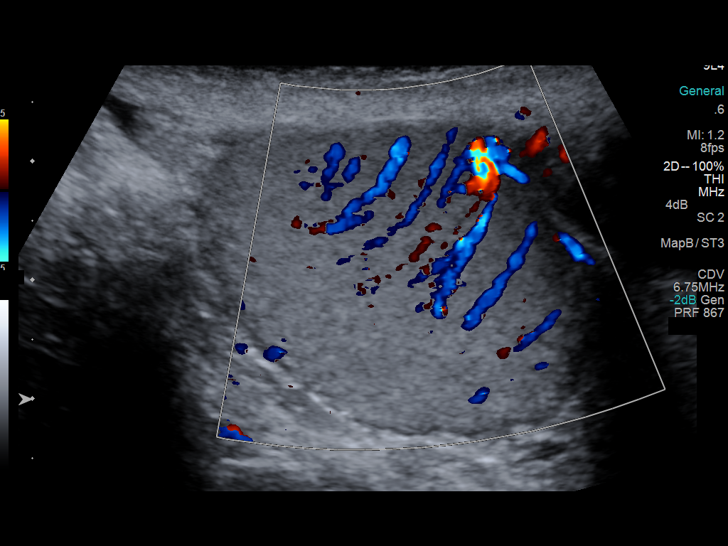
[im 58/64]
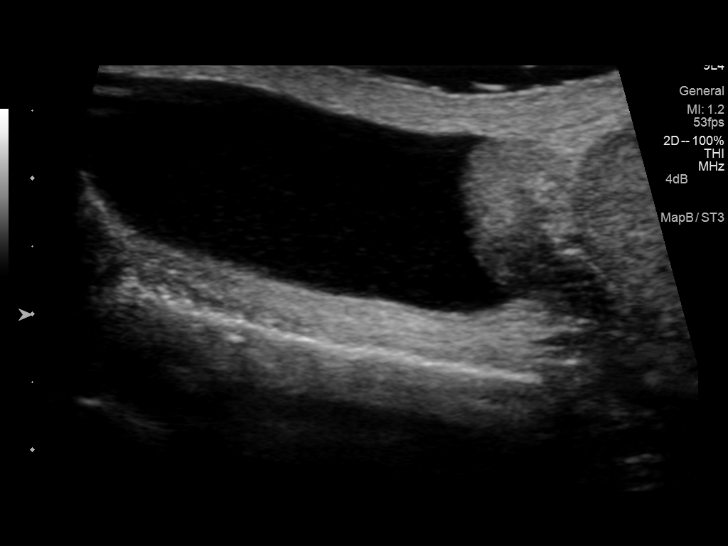
[im 64/64]
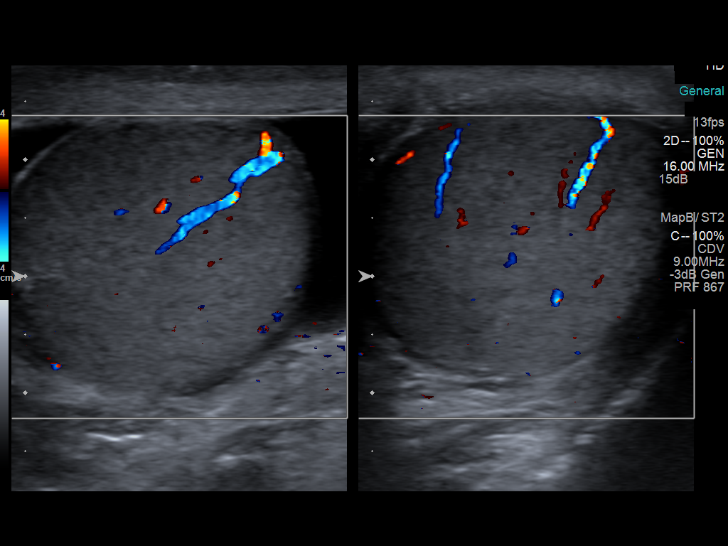

[14 of 25 positions shown; findings below may reference images not displayed]

FINDINGS: Right testicle

Measurements: 4.3 x 2.6 x 3.2 cm. No mass or microlithiasis
visualized.

Left testicle

Measurements: 4.2 x 2.6 x 3.2 cm. No mass or microlithiasis
visualized.

Right epididymis: 2 x 2 x 2 mm anechoic lesion with increased
through transmission in the right epididymis may represent a tiny
epididymal cyst or spermatocele.

Left epididymis:  Normal in size and appearance.

Hydrocele: Moderate right-sided hydrocele. Small left-sided
hydrocele.

Varicocele:  None visualized.
IMPRESSION: 1. Moderate right-sided hydrocele.
2. Small left hydrocele.
3. No evidence of testicular torsion or epididymo-orchitis.
4. Tiny 2 mm epididymal cyst or spermatocele on the right epididymis
incidentally noted.

## 2015-12-30 ENCOUNTER — Emergency Department (HOSPITAL_COMMUNITY): Payer: Managed Care, Other (non HMO)

## 2015-12-30 ENCOUNTER — Telehealth: Payer: Self-pay | Admitting: *Deleted

## 2015-12-30 ENCOUNTER — Encounter (HOSPITAL_COMMUNITY): Payer: Self-pay | Admitting: *Deleted

## 2015-12-30 ENCOUNTER — Emergency Department (HOSPITAL_COMMUNITY)
Admission: EM | Admit: 2015-12-30 | Discharge: 2015-12-30 | Disposition: A | Discharge status: Home / Self Care | Payer: Self-pay | Attending: Emergency Medicine | Admitting: Emergency Medicine

## 2015-12-30 DIAGNOSIS — R112 Nausea with vomiting, unspecified: Secondary | ICD-10-CM | POA: Insufficient documentation

## 2015-12-30 DIAGNOSIS — R109 Unspecified abdominal pain: Secondary | ICD-10-CM

## 2015-12-30 DIAGNOSIS — R1084 Generalized abdominal pain: Secondary | ICD-10-CM | POA: Insufficient documentation

## 2015-12-30 DIAGNOSIS — F17210 Nicotine dependence, cigarettes, uncomplicated: Secondary | ICD-10-CM | POA: Insufficient documentation

## 2015-12-30 LAB — URINALYSIS, ROUTINE W REFLEX MICROSCOPIC
Bilirubin Urine: NEGATIVE
GLUCOSE, UA: NEGATIVE mg/dL
Hgb urine dipstick: NEGATIVE
KETONES UR: NEGATIVE mg/dL
Leukocytes, UA: NEGATIVE
Nitrite: NEGATIVE
PROTEIN: NEGATIVE mg/dL
Specific Gravity, Urine: 1.024 (ref 1.005–1.030)
pH: 5.5 (ref 5.0–8.0)

## 2015-12-30 LAB — CBC
HEMATOCRIT: 42.5 % (ref 39.0–52.0)
Hemoglobin: 14.7 g/dL (ref 13.0–17.0)
MCH: 29.9 pg (ref 26.0–34.0)
MCHC: 34.6 g/dL (ref 30.0–36.0)
MCV: 86.4 fL (ref 78.0–100.0)
Platelets: 241 10*3/uL (ref 150–400)
RBC: 4.92 MIL/uL (ref 4.22–5.81)
RDW: 12.8 % (ref 11.5–15.5)
WBC: 17.5 10*3/uL — ABNORMAL HIGH (ref 4.0–10.5)

## 2015-12-30 LAB — COMPREHENSIVE METABOLIC PANEL
ALBUMIN: 4.3 g/dL (ref 3.5–5.0)
ALT: 17 U/L (ref 17–63)
AST: 21 U/L (ref 15–41)
Alkaline Phosphatase: 68 U/L (ref 38–126)
Anion gap: 12 (ref 5–15)
BUN: 12 mg/dL (ref 6–20)
CHLORIDE: 107 mmol/L (ref 101–111)
CO2: 21 mmol/L — AB (ref 22–32)
Calcium: 9.3 mg/dL (ref 8.9–10.3)
Creatinine, Ser: 0.8 mg/dL (ref 0.61–1.24)
GFR calc Af Amer: >60 mL/min (ref >60)
GFR calc non Af Amer: >60 mL/min (ref >60)
GLUCOSE: 122 mg/dL — AB (ref 65–99)
Potassium: 3.6 mmol/L (ref 3.5–5.1)
Sodium: 140 mmol/L (ref 135–145)
Total Bilirubin: 0.6 mg/dL (ref 0.3–1.2)
Total Protein: 7.3 g/dL (ref 6.5–8.1)

## 2015-12-30 LAB — LIPASE, BLOOD: LIPASE: 34 U/L (ref 11–51)

## 2015-12-30 MED ORDER — SODIUM CHLORIDE 0.9 % IV BOLUS (SEPSIS)
1000.0000 mL | Freq: Once | INTRAVENOUS | Status: AC
Start: 2015-12-30 — End: 2015-12-30
  Administered 2015-12-30: 1000 mL via INTRAVENOUS

## 2015-12-30 MED ORDER — ONDANSETRON 8 MG PO TBDP: 8.0000 mg | ORAL_TABLET | Freq: Three times a day (TID) | ORAL | Status: DC | PRN

## 2015-12-30 MED ORDER — KETOROLAC TROMETHAMINE 30 MG/ML IJ SOLN
30.0000 mg | Freq: Once | INTRAMUSCULAR | Status: AC
  Administered 2015-12-30: 30 mg via INTRAVENOUS
  Filled 2015-12-30: qty 1

## 2015-12-30 MED ORDER — PROMETHAZINE HCL 25 MG PO TABS: 25.0000 mg | ORAL_TABLET | Freq: Four times a day (QID) | ORAL | Status: DC | PRN

## 2015-12-30 MED ORDER — ONDANSETRON HCL 4 MG/2ML IJ SOLN
4.0000 mg | Freq: Once | INTRAMUSCULAR | Status: AC
  Administered 2015-12-30: 4 mg via INTRAVENOUS
  Filled 2015-12-30: qty 2

## 2015-12-30 MED ORDER — IOHEXOL 300 MG/ML  SOLN
100.0000 mL | Freq: Once | INTRAMUSCULAR | Status: AC | PRN
  Administered 2015-12-30: 100 mL via INTRAVENOUS

## 2015-12-30 MED ORDER — MORPHINE SULFATE (PF) 4 MG/ML IV SOLN
6.0000 mg | Freq: Once | INTRAVENOUS | Status: AC
  Administered 2015-12-30: 6 mg via INTRAVENOUS
  Filled 2015-12-30: qty 2

## 2015-12-30 MED ORDER — MORPHINE SULFATE (PF) 4 MG/ML IV SOLN: 4.0000 mg | Freq: Once | INTRAVENOUS | Status: DC

## 2015-12-30 MED ORDER — HYDROCODONE-ACETAMINOPHEN 5-325 MG PO TABS: 1.0000 | ORAL_TABLET | Freq: Four times a day (QID) | ORAL | Status: DC | PRN

## 2015-12-30 MED ORDER — IOHEXOL 300 MG/ML  SOLN
50.0000 mL | Freq: Once | INTRAMUSCULAR | Status: AC | PRN
  Administered 2015-12-30: 50 mL via ORAL

## 2015-12-30 NOTE — ED Notes (Signed)
Pt here for abdominal pain which woke him up this am.  Intermittent generalized abdominal pain, crampy and sharp.  No increased pain with palpation.  Pt reports some diarrhea for 2 weeks which he attributes to stress.  No n/v or fever.  No urinary symptoms.

## 2015-12-30 NOTE — ED Notes (Signed)
Called case manager as pt requests help getting meds due to being out of work

## 2015-12-30 NOTE — ED Provider Notes (Signed)
CSN: [id number]     Arrival date & time 12/30/15  0807 History   First MD Initiated Contact with Patient 12/30/15 [phone]     Chief Complaint  Patient presents with  . Abdominal Pain      HPI Patient presents to the emergency department with complaints of abdominal discomfort and pain which awoke him this morning.  He reports the pain is crampy and at times sharp.  It is generalized.  He's had some loose stools over the past 2 weeks.  He reports nausea and vomiting.  Nice fevers or chills.  Pain is moderate in severity and seems to be waxing and waning.  Denies back pain or flank pain.  No chest pain shortness of breath.  No other complaints   History reviewed. No pertinent past medical history. Past Surgical History  Procedure Laterality Date  . Hernia repair  2000   No family history on file. Social History  Substance Use Topics  . Smoking status: Current Every Day Smoker -- 0.50 packs/day for 10 years    Types: Cigarettes  . Smokeless tobacco: Never Used  . Alcohol Use: Yes     Comment: occasionally    Review of Systems  All other systems reviewed and are negative.     Allergies  Review of patient's allergies indicates no known allergies.  Home Medications   Prior to Admission medications   Medication Sig Start Date End Date Taking? Authorizing Provider  ranitidine (ZANTAC) 150 MG tablet Take 150 mg by mouth daily as needed for heartburn.   Yes Historical Provider, MD   BP 118/77 mmHg  Pulse 57  Temp(Src) 98.1 F (36.7 C) (Oral)  Resp 16  Wt 270 lb (122.471 kg)  SpO2 99% Physical Exam  Constitutional: He is oriented to person, place, and time. He appears well-developed and well-nourished.  HENT:  Head: Normocephalic and atraumatic.  Eyes: EOM are normal.  Neck: Normal range of motion.  Cardiovascular: Normal rate, regular rhythm, normal heart sounds and intact distal pulses.   Pulmonary/Chest: Effort normal and breath sounds normal. No respiratory distress.   Abdominal: Soft. He exhibits no distension.  Mild generalized abdominal tenderness without focality  Musculoskeletal: Normal range of motion.  Neurological: He is alert and oriented to person, place, and time.  Skin: Skin is warm and dry.  Psychiatric: He has a normal mood and affect. Judgment normal.  Nursing note and vitals reviewed.   ED Course  Procedures (including critical care time) Labs Review Labs Reviewed  COMPREHENSIVE METABOLIC PANEL - Abnormal; Notable for the following:    CO2 21 (*)    Glucose, Bld 122 (*)    All other components within normal limits  CBC - Abnormal; Notable for the following:    WBC 17.5 (*)    All other components within normal limits  URINALYSIS, ROUTINE W REFLEX MICROSCOPIC (NOT AT [facility])  LIPASE, BLOOD    Imaging Review Ct Abdomen Pelvis W Contrast  12/30/2015  CLINICAL DATA:  Generalized abdominal pain beginning early this morning. Elevated white blood cell count. History of a hernia repair. EXAM: CT ABDOMEN AND PELVIS WITH CONTRAST TECHNIQUE: Multidetector CT imaging of the abdomen and pelvis was performed using the standard protocol following bolus administration of intravenous contrast. CONTRAST:  OMNIPAQUE IOHEXOL 300 MG/ML SOLN, 50mL OMNIPAQUE IOHEXOL 300 MG/ML SOLN COMPARISON:  11/02/2005 FINDINGS: Lung bases:  Clear.  Heart normal size. Liver, spleen, gallbladder, pancreas, adrenal glands:  Normal. Kidneys, ureters, bladder:  Normal. Lymph nodes:  No  adenopathy. Ascites:  None. Gastrointestinal: Stomach, colon and small bowel are unremarkable. Normal appendix is visualized. Abdominal wall: Small fat containing umbilical hernia. Otherwise unremarkable. Musculoskeletal:  Unremarkable. IMPRESSION: 1. No acute findings. No findings to explain this patient's abdominal pain or elevated white blood cell count. 2. Normal appendix visualized. 3. Small fat containing umbilical hernia.  No other abnormalities. Electronically Signed   By: Amie Portland  M.D.   On: 12/30/2015 10:23   I have personally reviewed and evaluated these images and lab results as part of my medical decision-making.   EKG Interpretation None      MDM   Final diagnoses:  Abdominal pain    Patient feels better this time.  Discharge home in good condition.  CT scan without acute pathology.    Azalia Bilis, MD 12/30/15 [phone]

## 2015-12-30 NOTE — Progress Notes (Signed)
[facility] ED CM consulted for assist with medications for uninsured pt  Pt seen by [facility] staff stacy and given resources plus pt states he has a letter from [employer] from affordable care act pending his first payment Pt being d/c with hydrocodone and zofran Cm able to assist with goodrx coupon for $[facility].86 for [facility] hydrocodone  CM spoke with EDP Campos to change zofran ODT ($52 per goodrx) to phenergan po for affordability  Pt's ED RN updated Pt's ride arrived to assist with getting him home requesting phenergan be called in to [facility] at [address] [place]  CM spoke with pt who confirms uninsured [employer] resident with no pcp.  CM discussed and provided written information for uninsured accepting pcps, discussed the importance of pcp vs EDP services for f/u care, www.needymeds.org, www.goodrx.com, discounted pharmacies and other [employer] such as [facility] , [facility], affordable care act, financial assistance, uninsured dental services,  med assist, DSS and  health department  Reviewed resources for [employer] uninsured accepting pcps like [facility], family medicine at [facility], [facility], [facility], local urgent care centers, [facility], [facility] family practice, general medical clinics, family services of the [place], [facility] urgent care plus others, medication resources, [facility] out patient pharmacies and housing Pt voiced understanding and appreciation of resources provided   Provided [facility] contact information

## 2015-12-30 NOTE — ED Notes (Signed)
CSW in seeing pt.

## 2015-12-30 NOTE — ED Notes (Signed)
Bed: WA12 Expected date:  Expected time:  Means of arrival:  Comments: EMS abdominal pain 

## 2015-12-30 NOTE — Progress Notes (Signed)
CSW was informed by nurse that patient needed assistance with medications. CSW spoke with patient with his mother present. Patient reports he needs assistance with Vicodin and Zofran. Patient reports he has not been to the [facility] and [facility] for any type of prescription assistance. CSW informed patient CSW would speak with the Nurse CM regarding assistance with medications. Nurse CM reports she will speak with patient.  Elenore Paddy [phone] ED CSW 12/30/2015 12:34 PM

## 2015-12-30 NOTE — Discharge Instructions (Signed)

## 2016-04-10 ENCOUNTER — Emergency Department (HOSPITAL_COMMUNITY): Payer: No Typology Code available for payment source

## 2016-04-10 ENCOUNTER — Emergency Department (HOSPITAL_COMMUNITY)
Admission: EM | Admit: 2016-04-10 | Discharge: 2016-04-11 | Disposition: A | Discharge status: Home / Self Care | Payer: No Typology Code available for payment source | Attending: Emergency Medicine | Admitting: Emergency Medicine

## 2016-04-10 ENCOUNTER — Encounter (HOSPITAL_COMMUNITY): Payer: Self-pay | Admitting: Nurse Practitioner

## 2016-04-10 DIAGNOSIS — M545 Low back pain, unspecified: Secondary | ICD-10-CM

## 2016-04-10 DIAGNOSIS — Y939 Activity, unspecified: Secondary | ICD-10-CM | POA: Insufficient documentation

## 2016-04-10 DIAGNOSIS — M542 Cervicalgia: Secondary | ICD-10-CM | POA: Diagnosis present

## 2016-04-10 DIAGNOSIS — M546 Pain in thoracic spine: Secondary | ICD-10-CM | POA: Diagnosis not present

## 2016-04-10 DIAGNOSIS — R52 Pain, unspecified: Secondary | ICD-10-CM

## 2016-04-10 DIAGNOSIS — Z79891 Long term (current) use of opiate analgesic: Secondary | ICD-10-CM | POA: Insufficient documentation

## 2016-04-10 DIAGNOSIS — Z791 Long term (current) use of non-steroidal anti-inflammatories (NSAID): Secondary | ICD-10-CM | POA: Insufficient documentation

## 2016-04-10 DIAGNOSIS — Y999 Unspecified external cause status: Secondary | ICD-10-CM | POA: Insufficient documentation

## 2016-04-10 DIAGNOSIS — Y92410 Unspecified street and highway as the place of occurrence of the external cause: Secondary | ICD-10-CM | POA: Diagnosis not present

## 2016-04-10 DIAGNOSIS — F17210 Nicotine dependence, cigarettes, uncomplicated: Secondary | ICD-10-CM | POA: Diagnosis not present

## 2016-04-10 MED ORDER — IBUPROFEN 800 MG PO TABS
800.0000 mg | ORAL_TABLET | Freq: Once | ORAL | Status: AC
  Administered 2016-04-10: 800 mg via ORAL
  Filled 2016-04-10: qty 1

## 2016-04-10 MED ORDER — OXYCODONE-ACETAMINOPHEN 5-325 MG PO TABS
2.0000 | ORAL_TABLET | Freq: Once | ORAL | Status: AC
  Administered 2016-04-10: 2 via ORAL
  Filled 2016-04-10: qty 2

## 2016-04-10 MED ORDER — DIAZEPAM 5 MG PO TABS
10.0000 mg | ORAL_TABLET | Freq: Once | ORAL | Status: AC
  Administered 2016-04-10: 10 mg via ORAL
  Filled 2016-04-10: qty 2

## 2016-04-10 NOTE — ED Notes (Signed)
[facility] after [facility]. He reports that they took him off the stretcher and set him up in a wheelchair for 5 hours. Pt complaining of neck, back, and L arm pain. A&Ox4.
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
## 2016-04-11 MED ORDER — OXYCODONE-ACETAMINOPHEN 5-325 MG PO TABS: 2.0000 | ORAL_TABLET | ORAL | Status: DC | PRN

## 2016-04-11 MED ORDER — METHOCARBAMOL 500 MG PO TABS: 500.0000 mg | ORAL_TABLET | Freq: Two times a day (BID) | ORAL | Status: DC

## 2016-04-11 MED ORDER — IBUPROFEN 800 MG PO TABS: 800.0000 mg | ORAL_TABLET | Freq: Three times a day (TID) | ORAL | Status: DC

## 2016-04-11 NOTE — Discharge Instructions (Signed)
1. Medications: robaxin, naproxyn, percocet, usual home medications 2. Treatment: rest, drink plenty of fluids, gentle stretching as discussed, alternate ice and heat 3. Follow Up: Please followup with your primary doctor in 3 days for discussion of your diagnoses and further evaluation after today's visit; if you do not have a primary care doctor use the resource guide provided to find one;  Return to the ER for worsening back pain, worsening or different feelings of tingling, difficulty walking, loss of bowel or bladder control or urinary retention, or other concerning symptoms     Back Exercises The following exercises strengthen the muscles that help to support the back. They also help to keep the lower back flexible. Doing these exercises can help to prevent back pain or lessen existing pain. If you have back pain or discomfort, try doing these exercises 2-3 times each day or as told by your health care provider. When the pain goes away, do them once each day, but increase the number of times that you repeat the steps for each exercise (do more repetitions). If you do not have back pain or discomfort, do these exercises once each day or as told by your health care provider. EXERCISES Single Knee to Chest Repeat these steps 3-5 times for each leg: 1. Lie on your back on a firm bed or the floor with your legs extended. 2. Bring one knee to your chest. Your other leg should stay extended and in contact with the floor. 3. Hold your knee in place by grabbing your knee or thigh. 4. Pull on your knee until you feel a gentle stretch in your lower back. 5. Hold the stretch for 10-30 seconds. 6. Slowly release and straighten your leg. Pelvic Tilt Repeat these steps 5-10 times: 1. Lie on your back on a firm bed or the floor with your legs extended. 2. Bend your knees so they are pointing toward the ceiling and your feet are flat on the floor. 3. Tighten your lower abdominal muscles to press your  lower back against the floor. This motion will tilt your pelvis so your tailbone points up toward the ceiling instead of pointing to your feet or the floor. 4. With gentle tension and even breathing, hold this position for 5-10 seconds. Cat-Cow Repeat these steps until your lower back becomes more flexible: 1. Get into a hands-and-knees position on a firm surface. Keep your hands under your shoulders, and keep your knees under your hips. You may place padding under your knees for comfort. 2. Let your head hang down, and point your tailbone toward the floor so your lower back becomes rounded like the back of a cat. 3. Hold this position for 5 seconds. 4. Slowly lift your head and point your tailbone up toward the ceiling so your back forms a sagging arch like the back of a cow. 5. Hold this position for 5 seconds. Press-Ups Repeat these steps 5-10 times: 1. Lie on your abdomen (face-down) on the floor. 2. Place your palms near your head, about shoulder-width apart. 3. While you keep your back as relaxed as possible and keep your hips on the floor, slowly straighten your arms to raise the top half of your body and lift your shoulders. Do not use your back muscles to raise your upper torso. You may adjust the placement of your hands to make yourself more comfortable. 4. Hold this position for 5 seconds while you keep your back relaxed. 5. Slowly return to lying flat on the floor.  Bridges Repeat these steps 10 times: 1. Lie on your back on a firm surface. 2. Bend your knees so they are pointing toward the ceiling and your feet are flat on the floor. 3. Tighten your buttocks muscles and lift your buttocks off of the floor until your waist is at almost the same height as your knees. You should feel the muscles working in your buttocks and the back of your thighs. If you do not feel these muscles, slide your feet 1-2 inches farther away from your buttocks. 4. Hold this position for 3-5  seconds. 5. Slowly lower your hips to the starting position, and allow your buttocks muscles to relax completely. If this exercise is too easy, try doing it with your arms crossed over your chest. Abdominal Crunches Repeat these steps 5-10 times: 1. Lie on your back on a firm bed or the floor with your legs extended. 2. Bend your knees so they are pointing toward the ceiling and your feet are flat on the floor. 3. Cross your arms over your chest. 4. Tip your chin slightly toward your chest without bending your neck. 5. Tighten your abdominal muscles and slowly raise your trunk (torso) high enough to lift your shoulder blades a tiny bit off of the floor. Avoid raising your torso higher than that, because it can put too much stress on your low back and it does not help to strengthen your abdominal muscles. 6. Slowly return to your starting position. Back Lifts Repeat these steps 5-10 times: 1. Lie on your abdomen (face-down) with your arms at your sides, and rest your forehead on the floor. 2. Tighten the muscles in your legs and your buttocks. 3. Slowly lift your chest off of the floor while you keep your hips pressed to the floor. Keep the back of your head in line with the curve in your back. Your eyes should be looking at the floor. 4. Hold this position for 3-5 seconds. 5. Slowly return to your starting position. SEEK MEDICAL CARE IF:  Your back pain or discomfort gets much worse when you do an exercise.  Your back pain or discomfort does not lessen within 2 hours after you exercise. If you have any of these problems, stop doing these exercises right away. Do not do them again unless your health care provider says that you can. SEEK IMMEDIATE MEDICAL CARE IF:  You develop sudden, severe back pain. If this happens, stop doing the exercises right away. Do not do them again unless your health care provider says that you can.   This information is not intended to replace advice given to  you by your health care provider. Make sure you discuss any questions you have with your health care provider.   Document Released: 12/16/2004 Document Revised: 07/30/2015 Document Reviewed: 01/02/2015 [employer] Interactive Patient Education [employer]

## 2016-04-20 ENCOUNTER — Encounter (HOSPITAL_COMMUNITY): Payer: Self-pay

## 2016-04-20 ENCOUNTER — Emergency Department (HOSPITAL_COMMUNITY)
Admission: EM | Admit: 2016-04-20 | Discharge: 2016-04-20 | Disposition: A | Discharge status: Home / Self Care | Payer: No Typology Code available for payment source | Attending: Emergency Medicine | Admitting: Emergency Medicine

## 2016-04-20 DIAGNOSIS — M545 Low back pain, unspecified: Secondary | ICD-10-CM

## 2016-04-20 DIAGNOSIS — Y999 Unspecified external cause status: Secondary | ICD-10-CM | POA: Diagnosis not present

## 2016-04-20 DIAGNOSIS — Y92410 Unspecified street and highway as the place of occurrence of the external cause: Secondary | ICD-10-CM | POA: Diagnosis not present

## 2016-04-20 DIAGNOSIS — Y939 Activity, unspecified: Secondary | ICD-10-CM | POA: Diagnosis not present

## 2016-04-20 DIAGNOSIS — Z79891 Long term (current) use of opiate analgesic: Secondary | ICD-10-CM | POA: Insufficient documentation

## 2016-04-20 DIAGNOSIS — Z791 Long term (current) use of non-steroidal anti-inflammatories (NSAID): Secondary | ICD-10-CM | POA: Insufficient documentation

## 2016-04-20 DIAGNOSIS — F17210 Nicotine dependence, cigarettes, uncomplicated: Secondary | ICD-10-CM | POA: Insufficient documentation

## 2016-04-20 MED ORDER — IBUPROFEN 800 MG PO TABS
800.0000 mg | ORAL_TABLET | Freq: Once | ORAL | Status: AC
  Administered 2016-04-20: 800 mg via ORAL
  Filled 2016-04-20: qty 1

## 2016-04-20 MED ORDER — CYCLOBENZAPRINE HCL 10 MG PO TABS
10.0000 mg | ORAL_TABLET | Freq: Once | ORAL | Status: DC
  Filled 2016-04-20: qty 1

## 2016-04-20 MED ORDER — CYCLOBENZAPRINE HCL 10 MG PO TABS: 10.0000 mg | ORAL_TABLET | Freq: Two times a day (BID) | ORAL | Status: DC | PRN
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
## 2016-04-20 NOTE — Discharge Instructions (Signed)
Back Pain, Adult Back pain is very common in adults.The cause of back pain is rarely dangerous and the pain often gets better over time.The cause of your back pain may not be known. Some common causes of back pain include:  Strain of the muscles or ligaments supporting the spine.  Wear and tear (degeneration) of the spinal disks.  Arthritis.  Direct injury to the back. For many people, back pain may return. Since back pain is rarely dangerous, most people can learn to manage this condition on their own. HOME CARE INSTRUCTIONS Watch your back pain for any changes. The following actions may help to lessen any discomfort you are feeling:  Remain active. It is stressful on your back to sit or stand in one place for long periods of time. Do not sit, drive, or stand in one place for more than 30 minutes at a time. Take short walks on even surfaces as soon as you are able.Try to increase the length of time you walk each day.  Exercise regularly as directed by your health care provider. Exercise helps your back heal faster. It also helps avoid future injury by keeping your muscles strong and flexible.  Do not stay in bed.Resting more than 1-2 days can delay your recovery.  Pay attention to your body when you bend and lift. The most comfortable positions are those that put less stress on your recovering back. Always use proper lifting techniques, including:  Bending your knees.  Keeping the load close to your body.  Avoiding twisting.  Find a comfortable position to sleep. Use a firm mattress and lie on your side with your knees slightly bent. If you lie on your back, put a pillow under your knees.  Avoid feeling anxious or stressed.Stress increases muscle tension and can worsen back pain.It is important to recognize when you are anxious or stressed and learn ways to manage it, such as with exercise.  Take medicines only as directed by your health care provider. Over-the-counter  medicines to reduce pain and inflammation are often the most helpful.Your health care provider may prescribe muscle relaxant drugs.These medicines help dull your pain so you can more quickly return to your normal activities and healthy exercise.  Apply ice to the injured area:  Put ice in a plastic bag.  Place a towel between your skin and the bag.  Leave the ice on for 20 minutes, 2-3 times a day for the first 2-3 days. After that, ice and heat may be alternated to reduce pain and spasms.  Maintain a healthy weight. Excess weight puts extra stress on your back and makes it difficult to maintain good posture. SEEK MEDICAL CARE IF:  You have pain that is not relieved with rest or medicine.  You have increasing pain going down into the legs or buttocks.  You have pain that does not improve in one week.  You have night pain.  You lose weight.  You have a fever or chills. SEEK IMMEDIATE MEDICAL CARE IF:   You develop new bowel or bladder control problems.  You have unusual weakness or numbness in your arms or legs.  You develop nausea or vomiting.  You develop abdominal pain.  You feel faint.   This information is not intended to replace advice given to you by your health care provider. Make sure you discuss any questions you have with your health care provider.  Apply ice to affected area. Take flexeril and ibuprofen as needed for pain. All of your  prescriptions are $4.00 at [facility]. Return to the ED if you experience loss of control of your bowel and bladder, numbness and tingling in both of your lower extremities, loss of consciousness, fevers, vomiting, blurry vision.
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
## 2017-05-01 ENCOUNTER — Encounter (HOSPITAL_BASED_OUTPATIENT_CLINIC_OR_DEPARTMENT_OTHER): Payer: Self-pay | Admitting: Emergency Medicine

## 2017-05-01 ENCOUNTER — Emergency Department (HOSPITAL_BASED_OUTPATIENT_CLINIC_OR_DEPARTMENT_OTHER)
Admission: EM | Admit: 2017-05-01 | Discharge: 2017-05-01 | Disposition: A | Discharge status: Home / Self Care | Payer: Managed Care, Other (non HMO) | Attending: Emergency Medicine | Admitting: Emergency Medicine

## 2017-05-01 DIAGNOSIS — N5089 Other specified disorders of the male genital organs: Secondary | ICD-10-CM | POA: Insufficient documentation

## 2017-05-01 DIAGNOSIS — F17210 Nicotine dependence, cigarettes, uncomplicated: Secondary | ICD-10-CM | POA: Insufficient documentation

## 2017-05-01 DIAGNOSIS — Z113 Encounter for screening for infections with a predominantly sexual mode of transmission: Secondary | ICD-10-CM | POA: Insufficient documentation

## 2017-05-01 DIAGNOSIS — R197 Diarrhea, unspecified: Secondary | ICD-10-CM | POA: Insufficient documentation

## 2017-05-01 DIAGNOSIS — R112 Nausea with vomiting, unspecified: Secondary | ICD-10-CM | POA: Insufficient documentation

## 2017-05-01 LAB — URINALYSIS, ROUTINE W REFLEX MICROSCOPIC
Bilirubin Urine: NEGATIVE
Glucose, UA: NEGATIVE mg/dL
Hgb urine dipstick: NEGATIVE
Ketones, ur: NEGATIVE mg/dL
Leukocytes, UA: NEGATIVE
Nitrite: NEGATIVE
Protein, ur: NEGATIVE mg/dL
Specific Gravity, Urine: 1.013 (ref 1.005–1.030)
pH: 6 (ref 5.0–8.0)

## 2017-05-01 MED ORDER — ONDANSETRON 4 MG PO TBDP
4.0000 mg | ORAL_TABLET | Freq: Once | ORAL | Status: AC
  Administered 2017-05-01: 4 mg via ORAL
  Filled 2017-05-01: qty 1

## 2017-05-01 MED ORDER — ONDANSETRON 4 MG PO TBDP: ORAL_TABLET | ORAL | 0 refills | Status: DC

## 2017-05-01 MED ORDER — CEFTRIAXONE SODIUM 250 MG IJ SOLR
250.0000 mg | Freq: Once | INTRAMUSCULAR | Status: AC
  Administered 2017-05-01: 250 mg via INTRAMUSCULAR
  Filled 2017-05-01: qty 250

## 2017-05-01 MED ORDER — LIDOCAINE HCL (PF) 1 % IJ SOLN
INTRAMUSCULAR | Status: AC
  Administered 2017-05-01: 2.5 mL
  Filled 2017-05-01: qty 5

## 2017-05-01 MED ORDER — AZITHROMYCIN 250 MG PO TABS
1000.0000 mg | ORAL_TABLET | Freq: Once | ORAL | Status: AC
  Administered 2017-05-01: 1000 mg via ORAL
  Filled 2017-05-01: qty 4

## 2017-05-01 MED ORDER — GI COCKTAIL ~~LOC~~
30.0000 mL | Freq: Once | ORAL | Status: AC
  Administered 2017-05-01: 30 mL via ORAL
  Filled 2017-05-01: qty 30

## 2017-05-01 NOTE — ED Triage Notes (Addendum)
abd pain with N/V/D since last night after eating at [employer]. Also states he would like to be checked for STDs.

## 2017-05-01 NOTE — Discharge Instructions (Signed)
Try imodium for diarrhea.   Return for sudden worsening abdominal pain inability to eat or drink.

## 2017-05-01 NOTE — ED Notes (Signed)
ED Provider at bedside. 

## 2017-05-01 NOTE — ED Provider Notes (Signed)
[facility]   CSN: [id number] Arrival date & time: 05/01/17  0744     History   Chief Complaint Chief Complaint  Patient presents with  . Abdominal Pain    HPI Bruce Blake is a 36 y.o. male.  36 yo M with a chief complaint of nausea vomiting and diarrhea. Started last night about 11 PM. Having continued symptoms and in the morning. Patient was unable to continue working as he was having diarrhea and vomiting at the same time. Having some subjective fevers and chills as well. Complaining of a dull ache to his lower abdomen. Had a recent sexual encounter with then asked that he is concerned that may have resulted in an STD. Also complaining of nasal congestion for a month or so. Denies headaches. Denies penile discharge dysuria. 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 Denies rash.   The history is provided by the patient.  Abdominal Pain   This is a new problem. The current episode started yesterday. The problem occurs constantly. The problem has been gradually worsening. The pain is associated with eating. The pain is located in the generalized abdominal region. The quality of the pain is colicky and cramping. The pain is at a severity of 3/10. The pain is moderate. Associated symptoms include fever. Pertinent negatives include diarrhea, vomiting, dysuria, headaches, arthralgias and myalgias. Nothing aggravates the symptoms. Nothing relieves the symptoms.    History reviewed. No pertinent past medical history.  There are no active problems to display for this patient.   Past Surgical History:  Procedure Laterality Date  . HERNIA REPAIR  2000       Home Medications    Prior to Admission medications   Medication Sig Start Date End Date Taking? Authorizing Provider  ondansetron (ZOFRAN ODT) 4 MG disintegrating tablet 4mg  ODT q4 hours prn nausea/vomit 05/01/17   Melene PlanFloyd, Xayla Puzio, DO    Family  History No family history on file.  Social History Social History  Substance Use Topics  . Smoking status: Current Every Day Smoker    Packs/day: 0.50    Years: 10.00    Types: Cigarettes  . Smokeless tobacco: Never Used  . Alcohol use Yes     Comment: occasionally     Allergies   Patient has no known allergies.   Review of Systems Review of Systems  Constitutional: Positive for fever. Negative for chills.  HENT: Negative for congestion and facial swelling.   Eyes: Negative for discharge and visual disturbance.  Respiratory: Negative for shortness of breath.   Cardiovascular: Negative for chest pain and palpitations.  Gastrointestinal: Negative for abdominal pain, diarrhea and vomiting.  Genitourinary: Positive for scrotal swelling. Negative for difficulty urinating, dysuria and testicular pain.  Musculoskeletal: Negative for arthralgias and myalgias.  Skin: Negative for color change and rash.  Neurological: Negative for tremors, syncope and headaches.  Psychiatric/Behavioral: Negative for confusion and dysphoric mood.     Physical Exam Updated Vital Signs BP 138/88 (BP Location: Left Arm)   Pulse 73   Temp 99 F (37.2 C) (Oral)   Resp 18   Ht 6\' 4"  (1.93 m)   Wt 117.9 kg (260 lb)   SpO2 100%   BMI 31.65 kg/m   Physical Exam  Constitutional: He is oriented to person, place, and time. He appears well-developed and well-nourished.  HENT:  Head: Normocephalic and atraumatic.  Swollen turbinates, posterior nasal drip, no noted sinus ttp, tm normal  bilaterally.    Eyes: EOM are normal. Pupils are equal, round, and reactive to light.  Neck: Normal range of motion. Neck supple. No JVD present.  Cardiovascular: Normal rate and regular rhythm.  Exam reveals no gallop and no friction rub.   No murmur heard. Pulmonary/Chest: No respiratory distress. He has no wheezes.  Abdominal: He exhibits no distension and no mass. There is no tenderness. There is no rebound and no  guarding. Hernia confirmed negative in the right inguinal area and confirmed negative in the left inguinal area.  Some mild pubic discomfort, otherwise benign exam  Genitourinary: Penis normal. Cremasteric reflex is present. Right testis shows swelling. Right testis shows no mass. Left testis shows swelling. Left testis shows no mass. Circumcised.  Musculoskeletal: Normal range of motion.  Lymphadenopathy: No inguinal adenopathy noted on the right or left side.  Neurological: He is alert and oriented to person, place, and time.  Skin: No rash noted. No pallor.  Psychiatric: He has a normal mood and affect. His behavior is normal.  Nursing note and vitals reviewed.    ED Treatments / Results  Labs (all labs ordered are listed, but only abnormal results are displayed) Labs Reviewed  URINALYSIS, ROUTINE W REFLEX MICROSCOPIC  RPR  HIV ANTIBODY (ROUTINE TESTING)  GC/CHLAMYDIA PROBE AMP ([place]) NOT AT [facility]    EKG  EKG Interpretation None       Radiology No results found.  Procedures Procedures (including critical care time)  Medications Ordered in ED Medications  ondansetron (ZOFRAN-ODT) disintegrating tablet 4 mg (not administered)  gi cocktail (Maalox,Lidocaine,Donnatal) (not administered)  cefTRIAXone (ROCEPHIN) injection 250 mg (not administered)  azithromycin (ZITHROMAX) tablet 1,000 mg (not administered)  lidocaine (PF) (XYLOCAINE) 1 % injection (not administered)     Initial Impression / Assessment and Plan / ED Course  I have reviewed the triage vital signs and the nursing notes.  Pertinent labs & imaging results that were available during my care of the patient were reviewed by me and considered in my medical decision making (see chart for details).     36 yo M With a chief complaint of nausea vomiting and diarrhea. Patient having some mild crampy abdominal pain as well. Well-appearing and nontoxic on my exam. Nontender abdomen. I discussed that this is  most likely viral illness the patient. We'll hold off on laboratory evaluation at this time. Patient also wanted to be checked for sexually transmitted diseases. Treated with Rocephin and azithromycin. We'll give Zofran have him take Imodium at home. PCP follow-up.  Patient was also noted to have bilateral testicular swelling on exam. 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 Discussed with him that he may need to follow with urology to ensure that he is able to have children. He states that he or he has a 52-year-old son is not is really worried about this but has been having some symptomatology. Given urology follow-up.  8:26 AM:  I have discussed the diagnosis/risks/treatment options with the patient and family and believe the pt to be eligible for discharge home to follow-up with PCP. We also discussed returning to the ED immediately if new or worsening sx occur. We discussed the sx which are most concerning (e.g., sudden worsening pain, fever, inability to tolerate by mouth) that necessitate immediate return. Medications administered to the patient during their visit and any new prescriptions provided to the patient are listed below.  Medications given during this visit Medications  ondansetron (  ZOFRAN-ODT) disintegrating tablet 4 mg (4 mg Oral Given 05/01/17 0820)  gi cocktail (Maalox,Lidocaine,Donnatal) (30 mLs Oral Given 05/01/17 7829)  cefTRIAXone (ROCEPHIN) injection 250 mg (250 mg Intramuscular Given 05/01/17 0823)  azithromycin (ZITHROMAX) tablet 1,000 mg (1,000 mg Oral Given 05/01/17 0821)  lidocaine (PF) (XYLOCAINE) 1 % injection (2.5 mLs  Given 05/01/17 5621)     The patient appears reasonably screen and/or stabilized for discharge and I doubt any other medical condition or other [facility] requiring further screening, evaluation, or treatment in the ED at this time prior to discharge.    Final Clinical Impressions(s) / ED Diagnoses   Final diagnoses:  Nausea  vomiting and diarrhea  Testicular swelling    New Prescriptions New Prescriptions   ONDANSETRON (ZOFRAN ODT) 4 MG DISINTEGRATING TABLET    4mg  ODT q4 hours prn nausea/vomit     Melene Plan, DO 05/01/17 0827

## 2017-05-02 LAB — GC/CHLAMYDIA PROBE AMP (~~LOC~~) NOT AT ARMC
Chlamydia: NEGATIVE
Neisseria Gonorrhea: NEGATIVE

## 2017-05-02 LAB — RPR: RPR Ser Ql: NONREACTIVE

## 2017-05-02 LAB — HIV ANTIBODY (ROUTINE TESTING W REFLEX): HIV Screen 4th Generation wRfx: NONREACTIVE

## 2017-10-31 ENCOUNTER — Emergency Department (HOSPITAL_COMMUNITY): Payer: Self-pay

## 2017-10-31 ENCOUNTER — Encounter (HOSPITAL_COMMUNITY): Payer: Self-pay | Admitting: Emergency Medicine

## 2017-10-31 ENCOUNTER — Other Ambulatory Visit: Payer: Self-pay

## 2017-10-31 ENCOUNTER — Emergency Department (HOSPITAL_COMMUNITY)
Admission: EM | Admit: 2017-10-31 | Discharge: 2017-10-31 | Disposition: A | Discharge status: Home / Self Care | Payer: Self-pay | Attending: Emergency Medicine | Admitting: Emergency Medicine

## 2017-10-31 DIAGNOSIS — N200 Calculus of kidney: Secondary | ICD-10-CM | POA: Insufficient documentation

## 2017-10-31 DIAGNOSIS — F17210 Nicotine dependence, cigarettes, uncomplicated: Secondary | ICD-10-CM | POA: Insufficient documentation

## 2017-10-31 LAB — URINALYSIS, ROUTINE W REFLEX MICROSCOPIC
BILIRUBIN URINE: NEGATIVE
Bacteria, UA: NONE SEEN
GLUCOSE, UA: NEGATIVE mg/dL
Ketones, ur: NEGATIVE mg/dL
LEUKOCYTES UA: NEGATIVE
NITRITE: NEGATIVE
PH: 5 (ref 5.0–8.0)
Protein, ur: NEGATIVE mg/dL
SPECIFIC GRAVITY, URINE: 1.019 (ref 1.005–1.030)

## 2017-10-31 LAB — COMPREHENSIVE METABOLIC PANEL
ALBUMIN: 3.9 g/dL (ref 3.5–5.0)
ALK PHOS: 63 U/L (ref 38–126)
ALT: 22 U/L (ref 17–63)
ANION GAP: 10 (ref 5–15)
AST: 25 U/L (ref 15–41)
BUN: 10 mg/dL (ref 6–20)
CALCIUM: 9.2 mg/dL (ref 8.9–10.3)
CHLORIDE: 107 mmol/L (ref 101–111)
CO2: 23 mmol/L (ref 22–32)
Creatinine, Ser: 1.14 mg/dL (ref 0.61–1.24)
GFR calc Af Amer: >60 mL/min (ref >60)
GFR calc non Af Amer: >60 mL/min (ref >60)
GLUCOSE: 138 mg/dL — AB (ref 65–99)
Potassium: 3.5 mmol/L (ref 3.5–5.1)
SODIUM: 140 mmol/L (ref 135–145)
Total Bilirubin: 0.6 mg/dL (ref 0.3–1.2)
Total Protein: 6.8 g/dL (ref 6.5–8.1)

## 2017-10-31 LAB — CBC
HEMATOCRIT: 42.5 % (ref 39.0–52.0)
HEMOGLOBIN: 14.4 g/dL (ref 13.0–17.0)
MCH: 30.1 pg (ref 26.0–34.0)
MCHC: 33.9 g/dL (ref 30.0–36.0)
MCV: 88.7 fL (ref 78.0–100.0)
Platelets: 247 10*3/uL (ref 150–400)
RBC: 4.79 MIL/uL (ref 4.22–5.81)
RDW: 13.3 % (ref 11.5–15.5)
WBC: 9.8 10*3/uL (ref 4.0–10.5)

## 2017-10-31 LAB — LIPASE, BLOOD: LIPASE: 35 U/L (ref 11–51)

## 2017-10-31 MED ORDER — SODIUM CHLORIDE 0.9 % IV BOLUS (SEPSIS)
1000.0000 mL | Freq: Once | INTRAVENOUS | Status: AC
  Administered 2017-10-31: 1000 mL via INTRAVENOUS

## 2017-10-31 MED ORDER — MORPHINE SULFATE (PF) 4 MG/ML IV SOLN
4.0000 mg | Freq: Once | INTRAVENOUS | Status: AC
  Administered 2017-10-31: 4 mg via INTRAVENOUS
  Filled 2017-10-31: qty 1

## 2017-10-31 MED ORDER — ONDANSETRON 4 MG PO TBDP: 4.0000 mg | ORAL_TABLET | Freq: Three times a day (TID) | ORAL | 0 refills | Status: DC | PRN

## 2017-10-31 MED ORDER — NAPROXEN 500 MG PO TABS: 500.0000 mg | ORAL_TABLET | Freq: Two times a day (BID) | ORAL | 0 refills | Status: DC

## 2017-10-31 MED ORDER — ONDANSETRON HCL 4 MG/2ML IJ SOLN
4.0000 mg | Freq: Once | INTRAMUSCULAR | Status: AC
  Administered 2017-10-31: 4 mg via INTRAVENOUS
  Filled 2017-10-31: qty 2

## 2017-10-31 MED ORDER — OXYCODONE-ACETAMINOPHEN 5-325 MG PO TABS: 1.0000 | ORAL_TABLET | Freq: Four times a day (QID) | ORAL | 0 refills | Status: DC | PRN

## 2017-10-31 MED ORDER — KETOROLAC TROMETHAMINE 30 MG/ML IJ SOLN
30.0000 mg | Freq: Once | INTRAMUSCULAR | Status: AC
  Administered 2017-10-31: 30 mg via INTRAVENOUS
  Filled 2017-10-31: qty 1

## 2017-10-31 MED ORDER — OXYCODONE-ACETAMINOPHEN 5-325 MG PO TABS
1.0000 | ORAL_TABLET | Freq: Once | ORAL | Status: AC
  Administered 2017-10-31: 1 via ORAL
  Filled 2017-10-31: qty 1

## 2017-10-31 NOTE — ED Provider Notes (Signed)
Patient continues pain free and stable for discharge.    Margarita Grizzleay, Joakim Huesman, MD 10/31/17 [id number]

## 2017-10-31 NOTE — ED Provider Notes (Signed)
[facility] EMERGENCY DEPARTMENT Provider Note   CSN: [id number] Arrival date & time: 10/31/17  3086     History   Chief Complaint Chief Complaint  Patient presents with  . Abdominal Pain    HPI Bruce Blake is a 36 y.o. male.  HPI  This is a 36 year old male with no significant past medical history who presents with right-sided flank pain.  Patient reports that he had acute onset  flank and back pain that radiates into his lower abdomen and groin.  Pain is sharp.  He rates his pain at 20 out of 10.  He has never had pain like this before.  It happened 2 hours prior to arrival and woke him up from sleep.  Denies any history of kidney stones.  Has not noted any hematuria or dysuria.  No fevers, chest pain.  Does report nausea and vomiting.  No diarrhea.  History reviewed. No pertinent past medical history.  There are no active problems to display for this patient.   Past Surgical History:  Procedure Laterality Date  . HERNIA REPAIR  2000       Home Medications    Prior to Admission medications   Medication Sig Start Date End Date Taking? Authorizing Provider  naproxen (NAPROSYN) 500 MG tablet Take 1 tablet (500 mg total) by mouth 2 (two) times daily. 10/31/17   Shon Baton, MD  ondansetron (ZOFRAN ODT) 4 MG disintegrating tablet 4mg  ODT q4 hours prn nausea/vomit 05/01/17   Melene Plan, DO  ondansetron (ZOFRAN ODT) 4 MG disintegrating tablet Take 1 tablet (4 mg total) by mouth every 8 (eight) hours as needed for nausea or vomiting. 10/31/17   Horton, Mayer Masker, MD  oxyCODONE-acetaminophen (PERCOCET/ROXICET) 5-325 MG tablet Take 1-2 tablets by mouth every 6 (six) hours as needed for severe pain. 10/31/17   Shon Baton, MD    Family History No family history on file.  Social History Social History   Tobacco Use  . Smoking status: Current Every Day Smoker    Packs/day: 0.50    Years: 10.00    Pack years: 5.00    Types:  Cigarettes  . Smokeless tobacco: Never Used  Substance Use Topics  . Alcohol use: Yes    Comment: occasionally  . Drug use: No     Allergies   Patient has no known allergies.   Review of Systems Review of Systems  Constitutional: Negative for fever.  Respiratory: Negative for shortness of breath.   Cardiovascular: Negative for chest pain.  Gastrointestinal: Positive for nausea and vomiting. Negative for abdominal pain.  Genitourinary: Positive for flank pain.  Musculoskeletal: Positive for back pain.  All other systems reviewed and are negative.    Physical Exam Updated Vital Signs BP (!) 155/117 (BP Location: Right Arm)   Pulse 63   Temp 97.9 F (36.6 C) (Oral)   Resp (!) 24   SpO2 100%   Physical Exam  Constitutional: He is oriented to person, place, and time.  Uncomfortable appearing but nontoxic  HENT:  Head: Normocephalic and atraumatic.  Neck: Neck supple.  Cardiovascular: Normal rate, regular rhythm and normal heart sounds.  No murmur heard. Pulmonary/Chest: Effort normal and breath sounds normal. No respiratory distress. He has no wheezes.  Abdominal: Soft. Normal appearance and bowel sounds are normal. There is no tenderness. There is no rebound.  Genitourinary: Right testis shows no mass. Left testis shows no mass.  Genitourinary Comments: No CVA tenderness  Musculoskeletal: He exhibits  no edema.  Lymphadenopathy:    He has no cervical adenopathy.  Neurological: He is alert and oriented to person, place, and time.  Skin: Skin is warm and dry.  Psychiatric: He has a normal mood and affect.  Nursing note and vitals reviewed.    ED Treatments / Results  Labs (all labs ordered are listed, but only abnormal results are displayed) Labs Reviewed  COMPREHENSIVE METABOLIC PANEL - Abnormal; Notable for the following components:      Result Value   Glucose, Bld 138 (*)    All other components within normal limits  URINALYSIS, ROUTINE W REFLEX MICROSCOPIC  - Abnormal; Notable for the following components:   Hgb urine dipstick MODERATE (*)    Squamous Epithelial / LPF 0-5 (*)    All other components within normal limits  LIPASE, BLOOD  CBC    EKG  EKG Interpretation None       Radiology Ct Renal Stone Study  Result Date: 10/31/2017 CLINICAL DATA:  Acute onset of severe lower abdominal pain. EXAM: CT ABDOMEN AND PELVIS WITHOUT CONTRAST TECHNIQUE: Multidetector CT imaging of the abdomen and pelvis was performed following the standard protocol without IV contrast. COMPARISON:  CT of the abdomen and pelvis from 12/30/2015 FINDINGS: Lower chest: The visualized lung bases are grossly clear. The visualized portions of the mediastinum are unremarkable. Hepatobiliary: The liver is unremarkable in appearance. The gallbladder is unremarkable in appearance. The common bile duct remains normal in caliber. Pancreas: The pancreas is within normal limits. Spleen: The spleen is unremarkable in appearance. Adrenals/Urinary Tract: The adrenal glands are unremarkable in appearance. There is slight prominence of the left renal calyces, with mild stranding along the left ureter. This appears to reflect a 3 mm stone at the distal left ureter, just above the left vesicoureteral junction. No significant hydronephrosis is seen. No nonobstructing renal stones are identified. Stomach/Bowel: The stomach is unremarkable in appearance. The small bowel is within normal limits. The appendix is normal in caliber, without evidence of appendicitis. Diffuse fatty infiltration of the wall of the colon likely reflects sequelae of chronic inflammation. Vascular/Lymphatic: The abdominal aorta is unremarkable in appearance. The inferior vena cava is grossly unremarkable. No retroperitoneal lymphadenopathy is seen. No pelvic sidewall lymphadenopathy is identified. Reproductive: The bladder is decompressed and not well assessed. The prostate is mildly enlarged, measuring 5.0 cm in transverse  dimension. Other: No additional soft tissue abnormalities are seen. Musculoskeletal: No acute osseous abnormalities are identified. The visualized musculature is unremarkable in appearance. IMPRESSION: 1. Slight prominence of the left renal calyces, with mild stranding along the left ureter. This appears to reflect a 3 mm stone at the distal left ureter, just above the left vesicoureteral junction, which may be causing intermittent obstruction. 2. Mildly enlarged prostate. 3. Diffuse fatty infiltration of the wall of the colon likely reflects sequelae of chronic inflammation. Electronically Signed   By: Roanna RaiderJeffery  Chang M.D.   On: 10/31/2017 06:36    Procedures Procedures (including critical care time)  Medications Ordered in ED Medications  oxyCODONE-acetaminophen (PERCOCET/ROXICET) 5-325 MG per tablet 1 tablet (not administered)  morphine 4 MG/ML injection 4 mg (4 mg Intravenous Given 10/31/17 0606)  ondansetron (ZOFRAN) injection 4 mg (4 mg Intravenous Given 10/31/17 0606)  sodium chloride 0.9 % bolus 1,000 mL (1,000 mLs Intravenous New Bag/Given 10/31/17 0606)  ketorolac (TORADOL) 30 MG/ML injection 30 mg (30 mg Intravenous Given 10/31/17 0606)     Initial Impression / Assessment and Plan / ED Course  I have  reviewed the triage vital signs and the nursing notes.  Pertinent labs & imaging results that were available during my care of the patient were reviewed by me and considered in my medical decision making (see chart for details).     Patient presents with acute onset pain.  Given history and physical exam, most concerning for a kidney stone.  He does have a hemoglobin in his urine.  He was given pain and nausea medication and fluids.  CT scan shows a 3 mm stone on the left UVJ.  On recheck, he feels much better.  I discussed with him expectant management.  Stone will likely pass on its own.  Urology follow-up provided.  He will be given a short course of pain medication.  After history,  exam, and medical workup I feel the patient has been appropriately medically screened and is safe for discharge home. Pertinent diagnoses were discussed with the patient. Patient was given return precautions.   Final Clinical Impressions(s) / ED Diagnoses   Final diagnoses:  Kidney stone    ED Discharge Orders        Ordered    naproxen (NAPROSYN) 500 MG tablet  2 times daily     10/31/17 0653    oxyCODONE-acetaminophen (PERCOCET/ROXICET) 5-325 MG tablet  Every 6 hours PRN     10/31/17 0653    ondansetron (ZOFRAN ODT) 4 MG disintegrating tablet  Every 8 hours PRN     10/31/17 0656       Shon BatonHorton, Courtney F, MD 10/31/17 [phone]

## 2017-10-31 NOTE — Discharge Instructions (Signed)
You were seen today and found to have kidney stones.  Years is 3 mm and will likely pass on its own.  Follow-up with urology.  Take pain and nausea medication as needed.

## 2017-10-31 NOTE — ED Triage Notes (Signed)
Pt to triage via [facility]> c/o severe lower abd pain x 2 hours that woke him up.  Reports 4 soft BMs in the last 2 hours.  Pt vomiting after arrival to triage.

## 2018-01-13 ENCOUNTER — Encounter (HOSPITAL_COMMUNITY): Payer: Self-pay | Admitting: Emergency Medicine

## 2018-01-13 ENCOUNTER — Ambulatory Visit (HOSPITAL_COMMUNITY)
Admission: EM | Admit: 2018-01-13 | Discharge: 2018-01-13 | Disposition: A | Discharge status: Home / Self Care | Payer: Self-pay | Attending: Family Medicine | Admitting: Family Medicine

## 2018-01-13 ENCOUNTER — Ambulatory Visit (INDEPENDENT_AMBULATORY_CARE_PROVIDER_SITE_OTHER): Payer: Self-pay

## 2018-01-13 ENCOUNTER — Other Ambulatory Visit: Payer: Self-pay

## 2018-01-13 DIAGNOSIS — S9030XA Contusion of unspecified foot, initial encounter: Secondary | ICD-10-CM

## 2018-01-13 MED ORDER — PREDNISONE 10 MG PO TABS: ORAL_TABLET | ORAL | 0 refills | Status: DC

## 2018-01-13 NOTE — Discharge Instructions (Signed)
Follow up with your doctor next week if the pain persists.

## 2018-01-13 NOTE — ED Provider Notes (Signed)
[facility]   [id number] 01/13/18 Arrival Time: 1321   SUBJECTIVE:  Bruce Blake is a 37 y.o. male who presents to the urgent care with complaint of left ankle pain.    Pt reports jamming the anterior part of his left ankle on a forklift on Monday.  He was doing okay until he dropped a heavy box on the same area last night.     History reviewed. No pertinent past medical history. History reviewed. No pertinent family history. Social History   Socioeconomic History  . Marital status: Single    Spouse name: Not on file  . Number of children: Not on file  . Years of education: Not on file  . Highest education level: Not on file  Social Needs  . Financial resource strain: Not on file  . Food insecurity - worry: Not on file  . Food insecurity - inability: Not on file  . Transportation needs - medical: Not on file  . Transportation needs - non-medical: Not on file  Occupational History  . Not on file  Tobacco Use  . Smoking status: Current Every Day Smoker    Packs/day: 0.50    Years: 10.00    Pack years: 5.00    Types: Cigarettes  . Smokeless tobacco: Never Used  Substance and Sexual Activity  . Alcohol use: Yes    Comment: occasionally  . Drug use: No  . Sexual activity: Not on file  Other Topics Concern  . Not on file  Social History Narrative  . Not on file   Current Meds  Medication Sig  . [DISCONTINUED] ibuprofen (ADVIL,MOTRIN) 200 MG tablet Take 400 mg by mouth every 6 (six) hours as needed for moderate pain.   No Known Allergies    ROS: As per HPI, remainder of ROS negative.   OBJECTIVE:   Vitals:   01/13/18 1358  BP: 117/63  Pulse: 74  Temp: 98.7 F (37.1 C)  TempSrc: Oral  SpO2: 100%     General appearance: alert; no distress Eyes: PERRL; EOMI; conjunctiva normal HENT: normocephalic; atraumatic;  nasal mucosa normal; oral mucosa normal Neck: supple  Back: no CVA tenderness  Extremities: no cyanosis or edema;  symmetrical with no gross deformities; mild swelling and very tender anterior joint line of left ankle.  Skin: warm and dry Neurologic: normal gait; grossly normal Psychological: alert and cooperative; normal mood and affect      Labs:  Results for orders placed or performed during the hospital encounter of 10/31/17  Lipase, blood  Result Value Ref Range   Lipase 35 11 - 51 U/L  Comprehensive metabolic panel  Result Value Ref Range   Sodium 140 135 - 145 mmol/L   Potassium 3.5 3.5 - 5.1 mmol/L   Chloride 107 101 - 111 mmol/L   CO2 23 22 - 32 mmol/L   Glucose, Bld 138 (H) 65 - 99 mg/dL   BUN 10 6 - 20 mg/dL   Creatinine, Ser 5.781.14 0.61 - 1.24 mg/dL   Calcium 9.2 8.9 - 46.910.3 mg/dL   Total Protein 6.8 6.5 - 8.1 g/dL   Albumin 3.9 3.5 - 5.0 g/dL   AST 25 15 - 41 U/L   ALT 22 17 - 63 U/L   Alkaline Phosphatase 63 38 - 126 U/L   Total Bilirubin 0.6 0.3 - 1.2 mg/dL   GFR calc non Af Amer >60 >60 mL/min   GFR calc Af Amer >60 >60 mL/min   Anion gap 10 5 -  15  CBC  Result Value Ref Range   WBC 9.8 4.0 - 10.5 K/uL   RBC 4.79 4.22 - 5.81 MIL/uL   Hemoglobin 14.4 13.0 - 17.0 g/dL   HCT 16.1 09.6 - 04.5 %   MCV 88.7 78.0 - 100.0 fL   MCH 30.1 26.0 - 34.0 pg   MCHC 33.9 30.0 - 36.0 g/dL   RDW 40.9 81.1 - 91.4 %   Platelets 247 150 - 400 K/uL  Urinalysis, Routine w reflex microscopic  Result Value Ref Range   Color, Urine YELLOW YELLOW   APPearance CLEAR CLEAR   Specific Gravity, Urine 1.019 1.005 - 1.030   pH 5.0 5.0 - 8.0   Glucose, UA NEGATIVE NEGATIVE mg/dL   Hgb urine dipstick MODERATE (A) NEGATIVE   Bilirubin Urine NEGATIVE NEGATIVE   Ketones, ur NEGATIVE NEGATIVE mg/dL   Protein, ur NEGATIVE NEGATIVE mg/dL   Nitrite NEGATIVE NEGATIVE   Leukocytes, UA NEGATIVE NEGATIVE   RBC / HPF 0-5 0 - 5 RBC/hpf   WBC, UA 0-5 0 - 5 WBC/hpf   Bacteria, UA NONE SEEN NONE SEEN   Squamous Epithelial / LPF 0-5 (A) NONE SEEN   Mucus PRESENT    Hyaline Casts, UA PRESENT     Labs  Reviewed - No data to display  Dg Ankle Complete Left  Result Date: 01/13/2018 CLINICAL DATA:  Left ankle injury. Prior history of left ankle fracture. EXAM: LEFT ANKLE COMPLETE - 3+ VIEW COMPARISON:  09/19/2010. FINDINGS: Slight deformity noted the distal tibia possibly related old injury. No evidence of acute fracture. Mild soft tissue swelling. IMPRESSION: 1.  Mild soft tissue swelling.  No acute fracture identified. 2. Slight deformity noted the distal tibia consistent with history of old injury. Electronically Signed   By: Maisie Fus  Register   On: 01/13/2018 15:04       ASSESSMENT & PLAN:  1. Contusion of foot, unspecified laterality, initial encounter    See PCP if pain persists over the weekend Meds ordered this encounter  Medications  . predniSONE (DELTASONE) 10 MG tablet    Sig: Two daily with food    Dispense:  10 tablet    Refill:  0    Reviewed expectations re: course of current medical issues. Questions answered. Outlined signs and symptoms indicating need for more acute intervention. Patient verbalized understanding. After Visit Summary given.    Procedures:      Elvina Sidle, MD 01/13/18 1525

## 2018-01-13 NOTE — ED Triage Notes (Signed)
Pt reports jamming the anterior part of his left ankle on a forklift on Monday.  He was doing okay until he dropped a heavy box on the same area last night.

## 2018-05-31 ENCOUNTER — Other Ambulatory Visit: Payer: Self-pay

## 2018-05-31 ENCOUNTER — Encounter (HOSPITAL_BASED_OUTPATIENT_CLINIC_OR_DEPARTMENT_OTHER): Payer: Self-pay

## 2018-05-31 ENCOUNTER — Emergency Department (HOSPITAL_BASED_OUTPATIENT_CLINIC_OR_DEPARTMENT_OTHER)
Admission: EM | Admit: 2018-05-31 | Discharge: 2018-06-01 | Disposition: A | Discharge status: Home / Self Care | Payer: Self-pay | Attending: Emergency Medicine | Admitting: Emergency Medicine

## 2018-05-31 DIAGNOSIS — A64 Unspecified sexually transmitted disease: Secondary | ICD-10-CM

## 2018-05-31 DIAGNOSIS — F17210 Nicotine dependence, cigarettes, uncomplicated: Secondary | ICD-10-CM | POA: Insufficient documentation

## 2018-05-31 DIAGNOSIS — A599 Trichomoniasis, unspecified: Secondary | ICD-10-CM | POA: Insufficient documentation

## 2018-05-31 DIAGNOSIS — Z202 Contact with and (suspected) exposure to infections with a predominantly sexual mode of transmission: Secondary | ICD-10-CM | POA: Insufficient documentation

## 2018-05-31 LAB — URINALYSIS, ROUTINE W REFLEX MICROSCOPIC
Bilirubin Urine: NEGATIVE
Glucose, UA: NEGATIVE mg/dL
KETONES UR: 15 mg/dL — AB
Nitrite: NEGATIVE
PROTEIN: NEGATIVE mg/dL
Specific Gravity, Urine: >1.03 — ABNORMAL HIGH (ref 1.005–1.030)
pH: 6 (ref 5.0–8.0)

## 2018-05-31 LAB — URINALYSIS, MICROSCOPIC (REFLEX): WBC, UA: >50 WBC/hpf (ref 0–5)

## 2018-05-31 MED ORDER — METRONIDAZOLE 500 MG PO TABS
2000.0000 mg | ORAL_TABLET | Freq: Once | ORAL | Status: AC
  Administered 2018-05-31: 2000 mg via ORAL
  Filled 2018-05-31: qty 4

## 2018-05-31 MED ORDER — AZITHROMYCIN 1 G PO PACK
1.0000 g | PACK | Freq: Once | ORAL | Status: AC
  Administered 2018-05-31: 1 g via ORAL
  Filled 2018-05-31: qty 1

## 2018-05-31 MED ORDER — CEFTRIAXONE SODIUM 250 MG IJ SOLR
250.0000 mg | Freq: Once | INTRAMUSCULAR | Status: AC
  Administered 2018-05-31: 250 mg via INTRAMUSCULAR
  Filled 2018-05-31: qty 250

## 2018-05-31 NOTE — ED Triage Notes (Signed)
C/o penile d/c and lower abd/pelvic pain x 2 days-NAD-steady gait

## 2018-05-31 NOTE — ED Notes (Signed)
ED Provider at bedside. 

## 2018-05-31 NOTE — ED Provider Notes (Signed)
[facility] EMERGENCY DEPARTMENT Provider Note   CSN: [id number] Arrival date & time: 05/31/18  2237     History   Chief Complaint Chief Complaint  Patient presents with  . Penile Discharge    HPI Bruce Blake is a 37 y.o. male.  The history is provided by the patient.  Penile Discharge  This is a new problem. The current episode started more than 2 days ago. The problem occurs constantly. The problem has not changed since onset.Pertinent negatives include no chest pain, no abdominal pain, no headaches and no shortness of breath. Nothing aggravates the symptoms. Nothing relieves the symptoms. He has tried nothing for the symptoms. The treatment provided no relief.    History reviewed. No pertinent past medical history.  There are no active problems to display for this patient.   Past Surgical History:  Procedure Laterality Date  . HERNIA REPAIR  2000        Home Medications    Prior to Admission medications   Medication Sig Start Date End Date Taking? Authorizing Provider  predniSONE (DELTASONE) 10 MG tablet Two daily with food 01/13/18   Elvina Sidle, MD    Family History No family history on file.  Social History Social History   Tobacco Use  . Smoking status: Current Every Day Smoker    Packs/day: 0.50    Years: 10.00    Pack years: 5.00    Types: Cigarettes  . Smokeless tobacco: Never Used  Substance Use Topics  . Alcohol use: Yes    Comment: occasionally  . Drug use: No     Allergies   Patient has no known allergies.   Review of Systems Review of Systems  Constitutional: Negative for fever.  Respiratory: Negative for shortness of breath.   Cardiovascular: Negative for chest pain.  Gastrointestinal: Negative for abdominal pain.  Genitourinary: Positive for discharge and dysuria.  Neurological: Negative for headaches.  All other systems reviewed and are negative.    Physical Exam Updated Vital Signs BP (!) 145/89  (BP Location: Left Arm)   Pulse 74   Temp 98.4 F (36.9 C) (Oral)   Resp 18   Ht 6\' 4"  (1.93 m)   Wt 117.5 kg (259 lb)   SpO2 100%   BMI 31.53 kg/m   Physical Exam  Constitutional: He is oriented to person, place, and time. He appears well-developed and well-nourished. No distress.  HENT:  Head: Normocephalic and atraumatic.  Mouth/Throat: No oropharyngeal exudate.  Eyes: Pupils are equal, round, and reactive to light. Conjunctivae are normal.  Neck: Normal range of motion. Neck supple.  Cardiovascular: Normal rate, regular rhythm, normal heart sounds and intact distal pulses.  Pulmonary/Chest: Effort normal and breath sounds normal. No stridor. He has no wheezes. He has no rales.  Abdominal: Soft. Bowel sounds are normal. He exhibits no mass. There is no tenderness. There is no rebound and no guarding.  Musculoskeletal: Normal range of motion.  Neurological: He is alert and oriented to person, place, and time. He displays normal reflexes.  Skin: Skin is warm and dry. Capillary refill takes less than 2 seconds.  Psychiatric: He has a normal mood and affect.     ED Treatments / Results  Labs (all labs ordered are listed, but only abnormal results are displayed) Results for orders placed or performed during the hospital encounter of 05/31/18  Urinalysis, Routine w reflex microscopic  Result Value Ref Range   Color, Urine YELLOW YELLOW   APPearance CLOUDY (A)  CLEAR   Specific Gravity, Urine >1.030 (H) 1.005 - 1.030   pH 6.0 5.0 - 8.0   Glucose, UA NEGATIVE NEGATIVE mg/dL   Hgb urine dipstick TRACE (A) NEGATIVE   Bilirubin Urine NEGATIVE NEGATIVE   Ketones, ur 15 (A) NEGATIVE mg/dL   Protein, ur NEGATIVE NEGATIVE mg/dL   Nitrite NEGATIVE NEGATIVE   Leukocytes, UA LARGE (A) NEGATIVE  Urinalysis, Microscopic (reflex)  Result Value Ref Range   RBC / HPF 0-5 0 - 5 RBC/hpf   WBC, UA >50 0 - 5 WBC/hpf   Bacteria, UA RARE (A) NONE SEEN   Squamous Epithelial / LPF 0-5 0 - 5    Trichomonas, UA PRESENT (A) NONE SEEN   Ca Oxalate Crys, UA PRESENT    No results found.  Radiology No results found.  Procedures Procedures (including critical care time)  Medications Ordered in ED Medications  cefTRIAXone (ROCEPHIN) injection 250 mg (250 mg Intramuscular Given 05/31/18 2343)  azithromycin (ZITHROMAX) powder 1 g (1 g Oral Given 05/31/18 2343)  metroNIDAZOLE (FLAGYL) tablet 2,000 mg (2,000 mg Oral Given 05/31/18 2351)      Final Clinical Impressions(s) / ED Diagnoses  Treated for STI no sexual activity of any kind until 7 days after all partners informed and treated.    Return for pain, numbness, changes in vision or speech, fevers >100.4 unrelieved by medication, shortness of breath, intractable vomiting, or diarrhea, abdominal pain, Inability to tolerate liquids or food, cough, altered mental status or any concerns. No signs of systemic illness or infection. The patient is nontoxic-appearing on exam and vital signs are within normal limits. Will refer to urology for microscopy hematuria as patient is asymptomatic.  I have reviewed the triage vital signs and the nursing notes. Pertinent labs &imaging results that were available during my care of the patient were reviewed by me and considered in my medical decision making (see chart for details).  After history, exam, and medical workup I feel the patient has been appropriately medically screened and is safe for discharge home. Pertinent diagnoses were discussed with the patient. Patient was given return precautions.    Sorren Vallier, MD 06/01/18 [phone]

## 2018-06-01 ENCOUNTER — Encounter (HOSPITAL_BASED_OUTPATIENT_CLINIC_OR_DEPARTMENT_OTHER): Payer: Self-pay | Admitting: Emergency Medicine

## 2018-06-02 LAB — GC/CHLAMYDIA PROBE AMP (~~LOC~~) NOT AT ARMC
Chlamydia: NEGATIVE
Neisseria Gonorrhea: NEGATIVE

## 2019-11-02 ENCOUNTER — Emergency Department (HOSPITAL_BASED_OUTPATIENT_CLINIC_OR_DEPARTMENT_OTHER)
Admission: EM | Admit: 2019-11-02 | Discharge: 2019-11-02 | Disposition: A | Discharge status: Home / Self Care | Payer: Self-pay | Attending: Emergency Medicine | Admitting: Emergency Medicine

## 2019-11-02 ENCOUNTER — Other Ambulatory Visit: Payer: Self-pay

## 2019-11-02 ENCOUNTER — Encounter (HOSPITAL_BASED_OUTPATIENT_CLINIC_OR_DEPARTMENT_OTHER): Payer: Self-pay | Admitting: Emergency Medicine

## 2019-11-02 ENCOUNTER — Emergency Department (HOSPITAL_BASED_OUTPATIENT_CLINIC_OR_DEPARTMENT_OTHER): Payer: Self-pay

## 2019-11-02 DIAGNOSIS — F17210 Nicotine dependence, cigarettes, uncomplicated: Secondary | ICD-10-CM | POA: Insufficient documentation

## 2019-11-02 DIAGNOSIS — N433 Hydrocele, unspecified: Secondary | ICD-10-CM

## 2019-11-02 DIAGNOSIS — N5082 Scrotal pain: Secondary | ICD-10-CM | POA: Insufficient documentation

## 2019-11-02 DIAGNOSIS — Z711 Person with feared health complaint in whom no diagnosis is made: Secondary | ICD-10-CM

## 2019-11-02 DIAGNOSIS — F1210 Cannabis abuse, uncomplicated: Secondary | ICD-10-CM | POA: Insufficient documentation

## 2019-11-02 HISTORY — DX: Unilateral inguinal hernia, without obstruction or gangrene, not specified as recurrent: K40.90

## 2019-11-02 MED ORDER — AZITHROMYCIN 250 MG PO TABS
1000.0000 mg | ORAL_TABLET | Freq: Once | ORAL | Status: AC
Start: 2019-11-02 — End: 2019-11-02
  Administered 2019-11-02: 1000 mg via ORAL
  Filled 2019-11-02: qty 4

## 2019-11-02 MED ORDER — CEFTRIAXONE SODIUM 250 MG IJ SOLR
250.0000 mg | Freq: Once | INTRAMUSCULAR | Status: AC
  Administered 2019-11-02: 250 mg via INTRAMUSCULAR
  Filled 2019-11-02: qty 250

## 2019-11-02 MED ORDER — LIDOCAINE HCL (PF) 1 % IJ SOLN
INTRAMUSCULAR | Status: AC
  Administered 2019-11-02: 1.2 mL
  Filled 2019-11-02: qty 5

## 2019-11-02 NOTE — ED Triage Notes (Signed)
Had sex with a new partner 1 1/2 weeks ago.  Sts he has been feeling "weird" since then but cannot define it.  Denies burning or discharge. 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

## 2019-11-02 NOTE — ED Provider Notes (Signed)
Bruce Blake   CSN: [id number] Arrival date & time: 11/02/19  1008     History Chief Complaint  Patient presents with  . Wants STD check    Bruce Blake is a 38 y.o. male.  Pt presents to the ED today with right testicular swelling.  The pt also had unprotected sex with a woman about 1.5 weeks ago and wants to be checked for a STD.  He denies dysuria or penile d/c.        Past Medical History:  Diagnosis Date  . Inguinal hernia     There are no problems to display for this patient.   Past Surgical History:  Procedure Laterality Date  . HERNIA REPAIR  2000       No family history on file.  Social History   Tobacco Use  . Smoking status: Current Every Day Smoker    Packs/day: 0.50    Years: 10.00    Pack years: 5.00    Types: Cigarettes  . Smokeless tobacco: Never Used  Substance Use Topics  . Alcohol use: Yes    Comment: occasionally  . Drug use: Yes    Types: Marijuana    Comment: occ    Home Medications Prior to Admission medications   Medication Sig Start Date End Date Taking? Authorizing Provider  predniSONE (DELTASONE) 10 MG tablet Two daily with food 01/13/18   Robyn Haber, MD    Allergies    Patient has no known allergies.  Review of Systems   Review of Systems  Genitourinary: Positive for discharge.  All other systems reviewed and are negative.   Physical Exam Updated Vital Signs BP (!) 129/91 (BP Location: Right Arm)   Pulse 78   Temp 98.3 F (36.8 C) (Oral)   Resp 16   Ht 6\' 4"  (1.93 m)   Wt 128.4 kg   SpO2 99%   BMI 34.45 kg/m   Physical Exam Vitals and nursing Blake reviewed. Exam conducted with a chaperone present.  Constitutional:      Appearance: Normal appearance.  HENT:     Head: Normocephalic and atraumatic.     Right Ear: External ear normal.     Left Ear: External ear normal.     Nose: Nose normal.     Mouth/Throat:     Mouth: Mucous membranes are  moist.     Pharynx: Oropharynx is clear.  Eyes:     Extraocular Movements: Extraocular movements intact.     Conjunctiva/sclera: Conjunctivae normal.     Pupils: Pupils are equal, round, and reactive to light.  Cardiovascular:     Rate and Rhythm: Normal rate and regular rhythm.     Pulses: Normal pulses.     Heart sounds: Normal heart sounds.  Pulmonary:     Effort: Pulmonary effort is normal.     Breath sounds: Normal breath sounds.  Abdominal:     General: Abdomen is flat. Bowel sounds are normal.     Palpations: Abdomen is soft.  Genitourinary:    Penis: Normal.      Comments: Right testicle slightly swollen. Musculoskeletal:        General: Normal range of motion.     Cervical back: Normal range of motion and neck supple.  Skin:    General: Skin is warm.     Capillary Refill: Capillary refill takes less than 2 seconds.  Neurological:     General: No focal deficit present.     Mental  Status: He is alert and oriented to person, place, and time.  Psychiatric:        Mood and Affect: Mood normal.        Behavior: Behavior normal.        Thought Content: Thought content normal.        Judgment: Judgment normal.     ED Results / Procedures / Treatments   Labs (all labs ordered are listed, but only abnormal results are displayed) Labs Reviewed  GC/CHLAMYDIA PROBE AMP ([facility]) NOT AT [facility]    EKG None  Radiology US SCROTUM W/DOPPLER  Result Date: 11/02/2019 CLINICAL DATA:  Right testicular swelling, chronic. EXAM: SCROTAL ULTRASOUND DOPPLER ULTRASOUND OF THE TESTICLES TECHNIQUE: Complete ultrasound examination of the testicles, epididymis, and other scrotal structures was performed. Color and spectral Doppler ultrasound were also utilized to evaluate blood flow to the testicles. COMPARISON:  None. FINDINGS: Right testicle Measurements: 3.9 x 2.8 x 2.8 cm. No mass or microlithiasis visualized. Left testicle Measurements: 4 x 3 x 2.2 cm. No mass or microlithiasis  visualized. Right epididymis:  Normal in size and appearance. Left epididymis:  Normal in size and appearance. Hydrocele:  Bilateral hydroceles are noted. Varicocele:  None visualized. Pulsed Doppler interrogation of both testes demonstrates normal low resistance arterial and venous waveforms bilaterally. IMPRESSION: No evidence of testicular torsion. Bilateral hydroceles, right greater than left. Electronically Signed   By: Sherian Rein M.D.   On: 11/02/2019 11:53    Procedures Procedures (including critical care time)  Medications Ordered in ED Medications  cefTRIAXone (ROCEPHIN) injection 250 mg (250 mg Intramuscular Given 11/02/19 1100)  azithromycin (ZITHROMAX) tablet 1,000 mg (1,000 mg Oral Given 11/02/19 1100)  lidocaine (PF) (XYLOCAINE) 1 % injection (1.2 mLs  Given 11/02/19 1104)    ED Course  I have reviewed the triage vital signs and the nursing notes.  Pertinent labs & imaging results that were available during my care of the patient were reviewed by me and considered in my medical decision making (see chart for details).    MDM Rules/Calculators/A&P     CHA2DS2/VAS Stroke Risk Points      N/A >= 2 Points: High Risk  1 - 1.99 Points: Medium Risk  0 Points: Low Risk    A final score could not be computed because of missing components.: Last  Change: N/A     This score determines the patient's risk of having a stroke if the  patient has atrial fibrillation.      This score is not applicable to this patient. Components are not  calculated.                   Pt wished to be treated for STD while here, so he was given rocephin/zithromax.  US showed hydroceles.  Pt told to f/u with urology if sx worsen.  Final Clinical Impression(s) / ED Diagnoses Final diagnoses:  Concern about STD in male without diagnosis  Hydrocele in adult    Rx / DC Orders ED Discharge Orders    None       Jacalyn Lefevre, MD 11/02/19 1208

## 2019-11-02 NOTE — ED Notes (Signed)
Patient transported to Ultrasound 

## 2019-11-02 NOTE — ED Notes (Signed)
ED Provider at bedside. 

## 2019-11-05 LAB — GC/CHLAMYDIA PROBE AMP (~~LOC~~) NOT AT ARMC
Chlamydia: NEGATIVE
Neisseria Gonorrhea: NEGATIVE

## 2020-01-24 IMAGING — US US SCROTUM W/ DOPPLER COMPLETE
1 series · 14 of 25 positions shown · non-contrast
Comparison: None.

CLINICAL DATA: Right testicular swelling, chronic.

EXAM:
SCROTAL ULTRASOUND
DOPPLER ULTRASOUND OF THE TESTICLES
TECHNIQUE: Complete ultrasound examination of the testicles, epididymis, and
other scrotal structures was performed. Color and spectral Doppler
ultrasound were also utilized to evaluate blood flow to the
testicles.

[Series 1: us scrotum w/ doppler complete · 38 acquisitions, 14 frames shown]
[im 1/38]
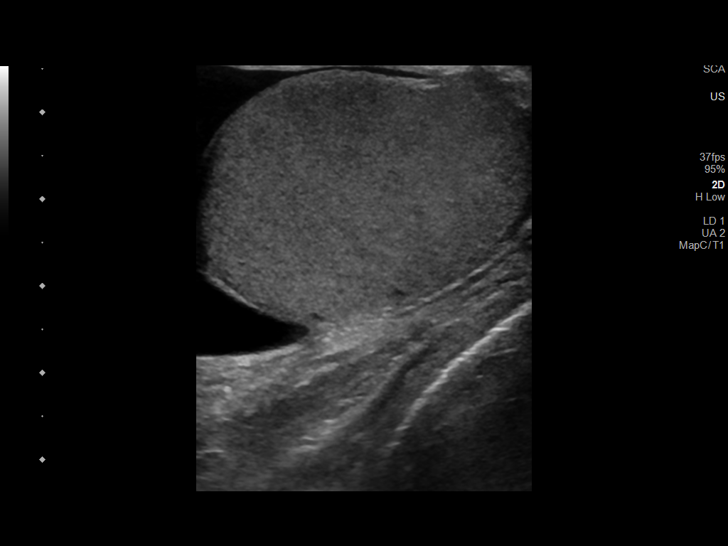
[im 4/38]
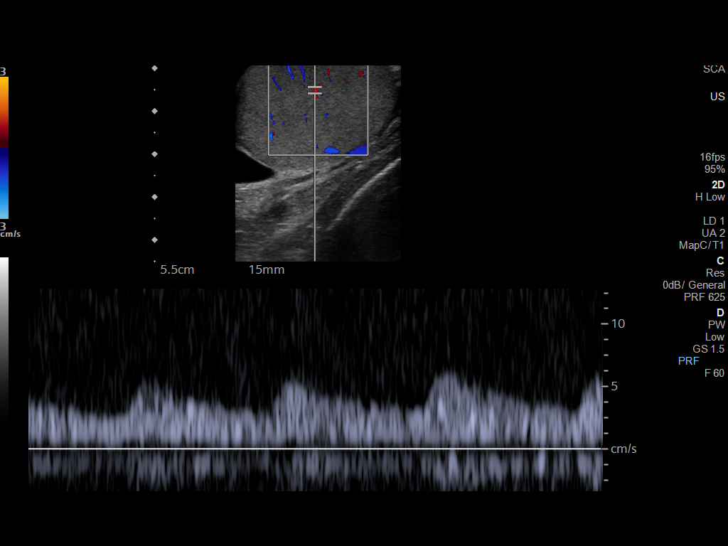
[im 7/38]
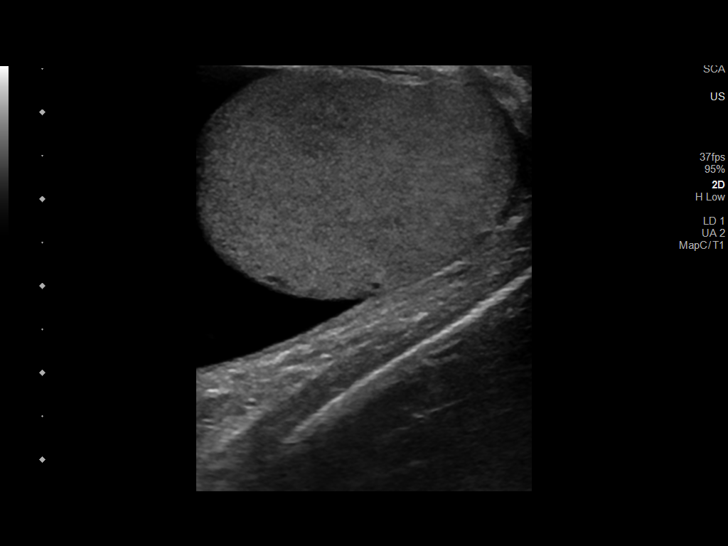
[im 10/38]
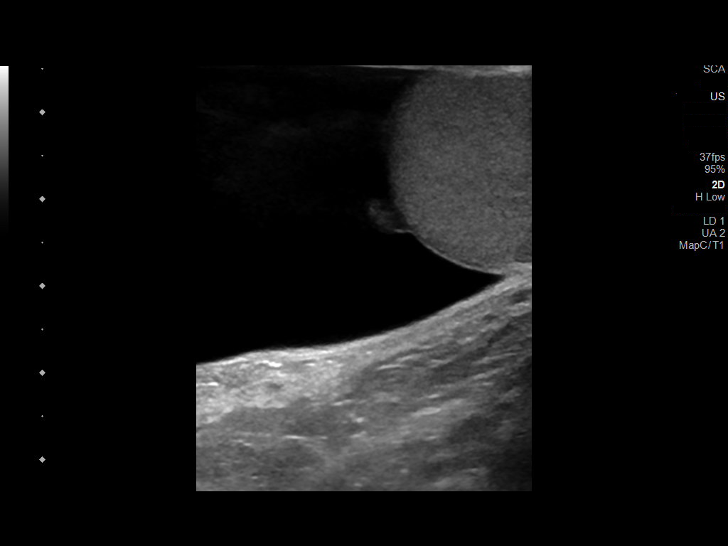
[im 13/38]
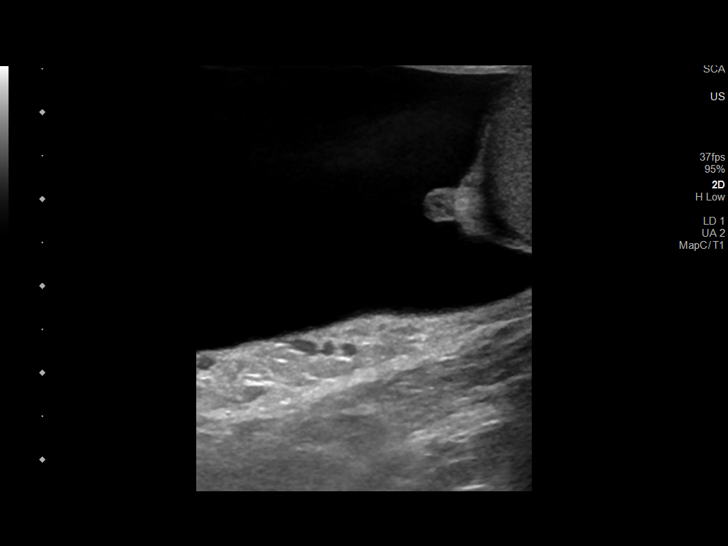
[im 14/38]
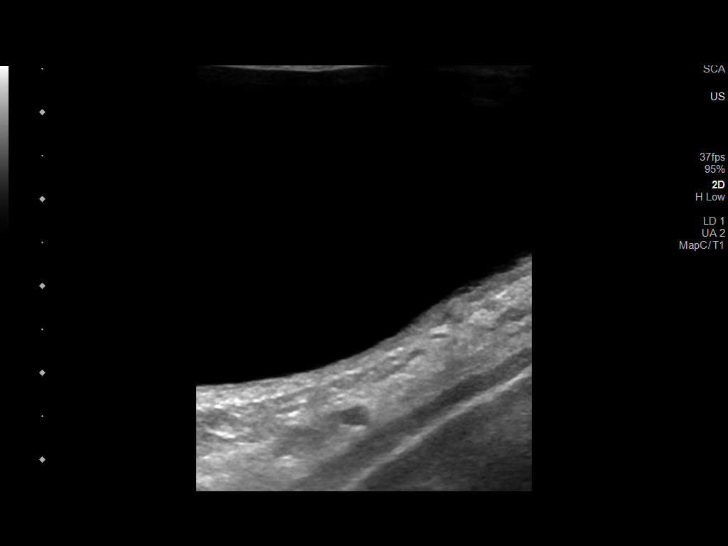
[im 17/38]
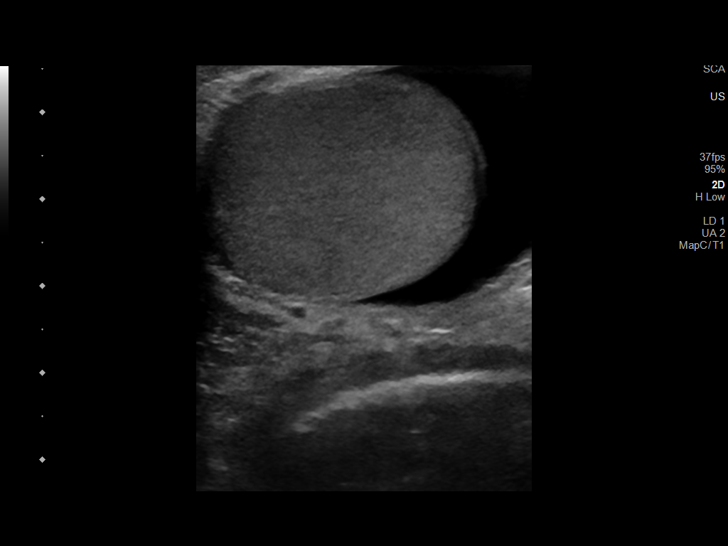
[im 21/38]
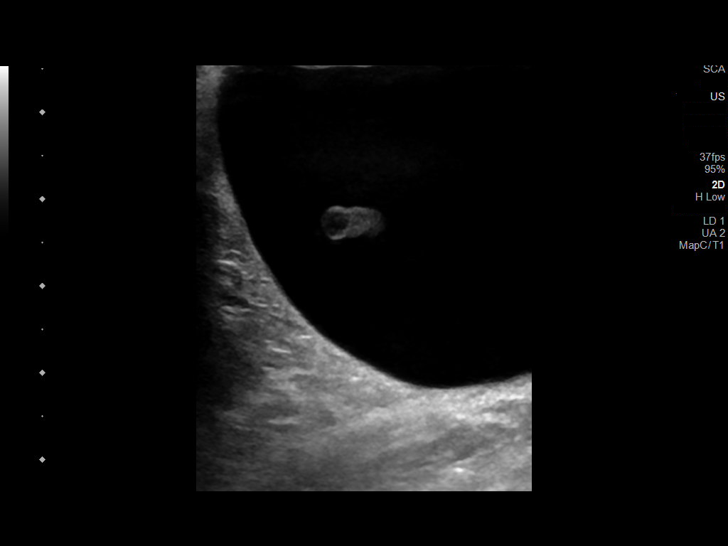
[im 24/38]
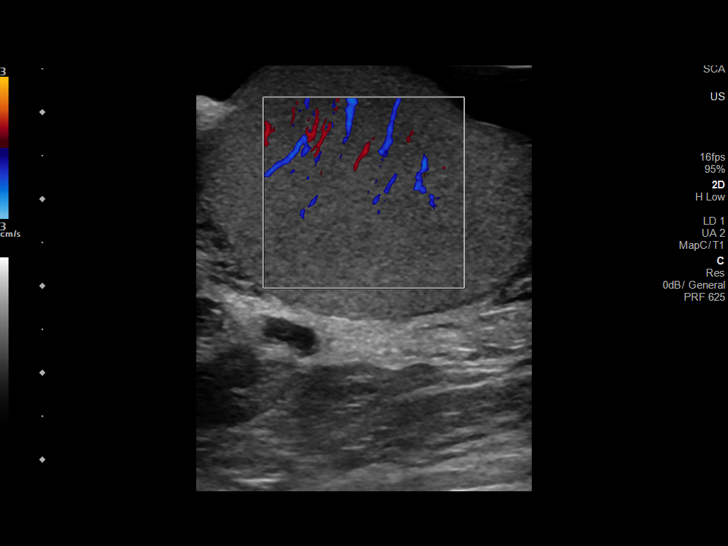
[im 25/38]
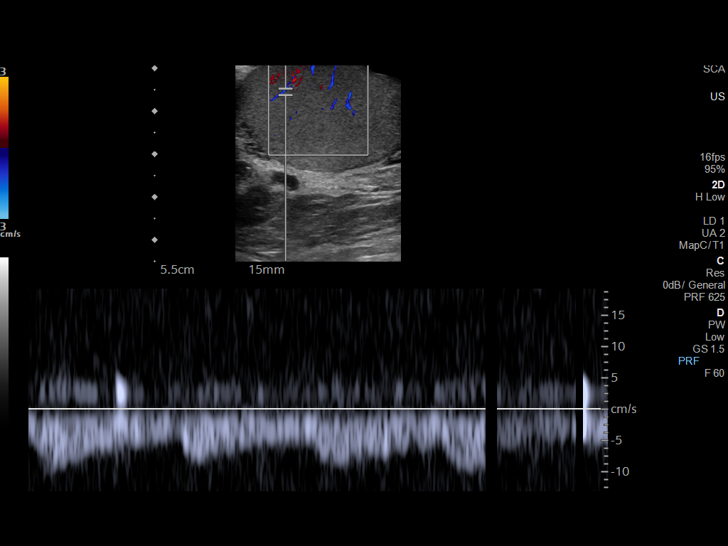
[im 28/38]
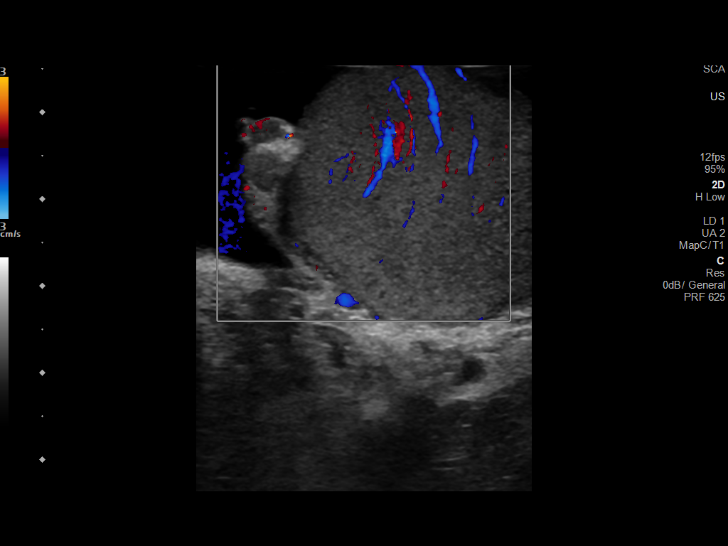
[im 31/38]
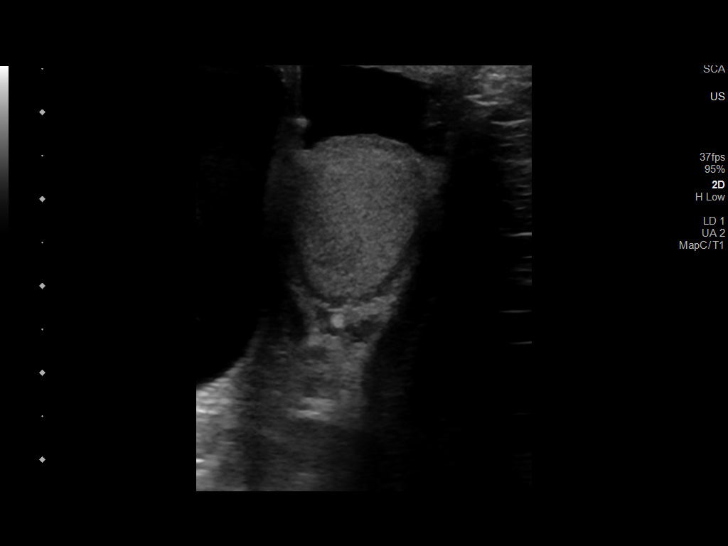
[im 34/38]
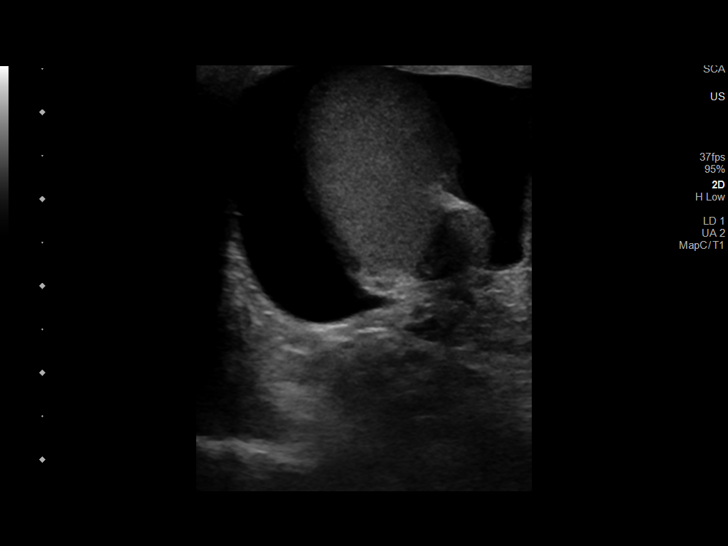
[im 38/38]
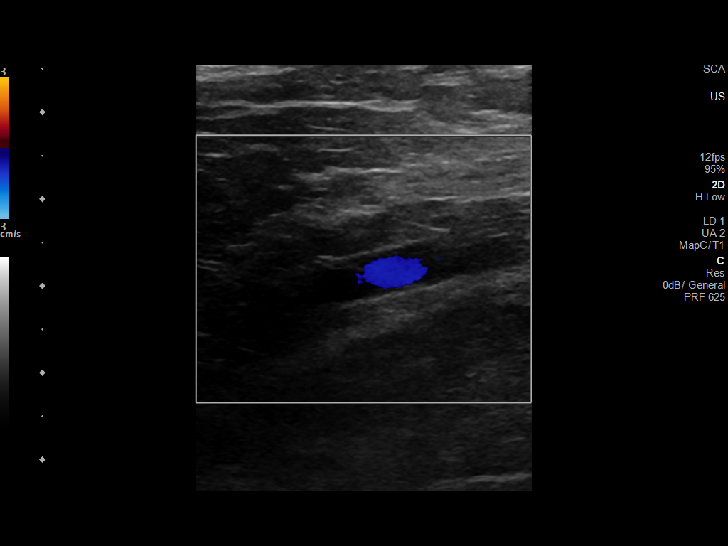

[14 of 25 positions shown; findings below may reference images not displayed]

FINDINGS: Right testicle

Measurements: 3.9 x 2.8 x 2.8 cm. No mass or microlithiasis
visualized.

Left testicle

Measurements: 4 x 3 x 2.2 cm. No mass or microlithiasis visualized.

Right epididymis:  Normal in size and appearance.

Left epididymis:  Normal in size and appearance.

Hydrocele:  Bilateral hydroceles are noted.

Varicocele:  None visualized.

Pulsed Doppler interrogation of both testes demonstrates normal low
resistance arterial and venous waveforms bilaterally.
IMPRESSION: No evidence of testicular torsion.

Bilateral hydroceles, right greater than left.

## 2020-10-09 ENCOUNTER — Emergency Department (HOSPITAL_BASED_OUTPATIENT_CLINIC_OR_DEPARTMENT_OTHER)
Admission: EM | Admit: 2020-10-09 | Discharge: 2020-10-09 | Disposition: A | Discharge status: Home / Self Care | Payer: Self-pay | Attending: Emergency Medicine | Admitting: Emergency Medicine

## 2020-10-09 ENCOUNTER — Other Ambulatory Visit: Payer: Self-pay

## 2020-10-09 ENCOUNTER — Encounter (HOSPITAL_BASED_OUTPATIENT_CLINIC_OR_DEPARTMENT_OTHER): Payer: Self-pay

## 2020-10-09 DIAGNOSIS — K625 Hemorrhage of anus and rectum: Secondary | ICD-10-CM | POA: Insufficient documentation

## 2020-10-09 DIAGNOSIS — F17210 Nicotine dependence, cigarettes, uncomplicated: Secondary | ICD-10-CM | POA: Insufficient documentation

## 2020-10-09 HISTORY — DX: Inflammatory disease of prostate, unspecified: N41.9

## 2020-10-09 LAB — CBC WITH DIFFERENTIAL/PLATELET
Abs Immature Granulocytes: 0.06 10*3/uL (ref 0.00–0.07)
Basophils Absolute: 0 10*3/uL (ref 0.0–0.1)
Basophils Relative: 0 %
Eosinophils Absolute: 0.1 10*3/uL (ref 0.0–0.5)
Eosinophils Relative: 1 %
HCT: 44 % (ref 39.0–52.0)
Hemoglobin: 15.1 g/dL (ref 13.0–17.0)
Immature Granulocytes: 1 %
Lymphocytes Relative: 25 %
Lymphs Abs: 2.7 10*3/uL (ref 0.7–4.0)
MCH: 30.1 pg (ref 26.0–34.0)
MCHC: 34.3 g/dL (ref 30.0–36.0)
MCV: 87.8 fL (ref 80.0–100.0)
Monocytes Absolute: 0.9 10*3/uL (ref 0.1–1.0)
Monocytes Relative: 9 %
Neutro Abs: 7.2 10*3/uL (ref 1.7–7.7)
Neutrophils Relative %: 64 %
Platelets: 251 10*3/uL (ref 150–400)
RBC: 5.01 MIL/uL (ref 4.22–5.81)
RDW: 12.6 % (ref 11.5–15.5)
WBC: 11 10*3/uL — ABNORMAL HIGH (ref 4.0–10.5)
nRBC: 0 % (ref 0.0–0.2)

## 2020-10-09 LAB — BASIC METABOLIC PANEL
Anion gap: 6 (ref 5–15)
BUN: 14 mg/dL (ref 6–20)
CO2: 30 mmol/L (ref 22–32)
Calcium: 10.1 mg/dL (ref 8.9–10.3)
Chloride: 105 mmol/L (ref 98–111)
Creatinine, Ser: 1.04 mg/dL (ref 0.61–1.24)
GFR, Estimated: >60 mL/min (ref >60)
Glucose, Bld: 80 mg/dL (ref 70–99)
Potassium: 3.7 mmol/L (ref 3.5–5.1)
Sodium: 141 mmol/L (ref 135–145)

## 2020-10-09 LAB — OCCULT BLOOD X 1 CARD TO LAB, STOOL: Fecal Occult Bld: NEGATIVE

## 2020-10-09 NOTE — ED Provider Notes (Signed)
[facility] EMERGENCY DEPARTMENT Provider Note   CSN: [id number] Arrival date & time: 10/09/20  1231     History Chief Complaint  Patient presents with   Rectal Bleeding    Bruce Blake is a 39 y.o. male.  Patient is a 39 year old male with history of prostatitis.  He presents today for evaluation of bloody stool.  States that he had a bowel movement today and noticed blood on the toilet paper and in the toilet water.  This was bright red in color.  He denies to me that he is experiencing any pain in his abdomen or rectum.  He denies any lightheadedness, dizziness, or shortness of breath.  He denies any fevers or chills.  He denies recent episodes of constipation or having to strain with stools.  The history is provided by the patient.       Past Medical History:  Diagnosis Date   Inguinal hernia    Prostate infection     There are no problems to display for this patient.   Past Surgical History:  Procedure Laterality Date   HERNIA REPAIR  2000       No family history on file.  Social History   Tobacco Use   Smoking status: Current Every Day Smoker    Packs/day: 0.50    Years: 0.00    Pack years: 0.00    Types: Cigarettes   Smokeless tobacco: Never Used  Building services engineer Use: Never used  Substance Use Topics   Alcohol use: Yes    Comment: occasionally   Drug use: Yes    Types: Marijuana    Home Medications Prior to Admission medications   Medication Sig Start Date End Date Taking? Authorizing Provider  predniSONE (DELTASONE) 10 MG tablet Two daily with food 01/13/18   Elvina Sidle, MD    Allergies    Patient has no known allergies.  Review of Systems   Review of Systems  All other systems reviewed and are negative.   Physical Exam Updated Vital Signs BP (!) 147/84 (BP Location: Left Arm)    Pulse 73    Temp 98.9 F (37.2 C) (Oral)    Resp 18    Ht 6\' 4"  (1.93 m)    Wt 130.2 kg    SpO2 98%    BMI 34.93 kg/m    Physical Exam Vitals and nursing note reviewed.  Constitutional:      General: He is not in acute distress.    Appearance: He is well-developed. He is not diaphoretic.  HENT:     Head: Normocephalic and atraumatic.  Cardiovascular:     Rate and Rhythm: Normal rate and regular rhythm.     Heart sounds: No murmur heard.  No friction rub.  Pulmonary:     Effort: Pulmonary effort is normal. No respiratory distress.     Breath sounds: Normal breath sounds. No wheezing or rales.  Abdominal:     General: Bowel sounds are normal. There is no distension.     Palpations: Abdomen is soft.     Tenderness: There is no abdominal tenderness.  Genitourinary:    Rectum: Normal.     Comments: The rectum is normal in appearance.  I see no evidence for abscess or hemorrhoid.  There are no obvious fissures or masses. Musculoskeletal:        General: Normal range of motion.     Cervical back: Normal range of motion and neck supple.  Skin:  General: Skin is warm and dry.  Neurological:     Mental Status: He is alert and oriented to person, place, and time.     Coordination: Coordination normal.     ED Results / Procedures / Treatments   Labs (all labs ordered are listed, but only abnormal results are displayed) Labs Reviewed  BASIC METABOLIC PANEL  CBC WITH DIFFERENTIAL/PLATELET  OCCULT BLOOD X 1 CARD TO LAB, STOOL    EKG None  Radiology No results found.  Procedures Procedures (including critical care time)  Medications Ordered in ED Medications - No data to display  ED Course  I have reviewed the triage vital signs and the nursing notes.  Pertinent labs & imaging results that were available during my care of the patient were reviewed by me and considered in my medical decision making (see chart for details).    MDM Rules/Calculators/A&P  Patient is a 39 year old male with no significant past medical history.  He presents today with complaints of rectal bleeding that is  painless.  He had 1 episode of this this morning.  He noticed blood on the toilet paper and blood in the toilet water.  Patient's laboratory studies are unremarkable with hemoglobin of 15.1.  Rectal examination reveals no evidence for hemorrhoids or obvious fissure.  Stool is brown and heme-negative.  At this point, I am uncertain as to where this episode of hematochezia came from, but may possibly related to a fissure or internal hemorrhoid.  Either way, I have recommended the patient follow-up with gastroenterology as he may require colonoscopy.  Final Clinical Impression(s) / ED Diagnoses Final diagnoses:  None    Rx / DC Orders ED Discharge Orders    None       Geoffery Lyons, MD 10/09/20 1447

## 2020-10-09 NOTE — ED Notes (Signed)
Pt reports he noticed both bright and dark red blood when he wiped after a BM today. Denies pain.

## 2020-10-09 NOTE — Discharge Instructions (Signed)
Begin using stool softeners: Colace (equate stool softener) 100 mg twice daily.    Also use Metamucil for fiber supplementation, 1 heaping teaspoon in a glass of water twice daily.  Follow-up with gastroenterology in the next week.  The contact information for [facility] has been provided in this discharge summary for you to call and make these arrangements.  Return to the emergency department in the meantime if you develop worsening bleeding, severe abdominal pain, high fever, or other new and concerning symptoms.

## 2020-10-09 NOTE — ED Triage Notes (Signed)
Pt c/o rectal bleeding with BM today-NAD-steady gait

## 2020-10-09 NOTE — ED Notes (Signed)
Pt verbalized understanding of dc instructions.

## 2020-10-09 NOTE — ED Notes (Signed)
ED Provider at bedside. 

## 2020-12-25 ENCOUNTER — Emergency Department (HOSPITAL_BASED_OUTPATIENT_CLINIC_OR_DEPARTMENT_OTHER)
Admission: EM | Admit: 2020-12-25 | Discharge: 2020-12-25 | Disposition: A | Discharge status: Home / Self Care | Payer: HRSA Program | Attending: Emergency Medicine | Admitting: Emergency Medicine

## 2020-12-25 ENCOUNTER — Other Ambulatory Visit: Payer: Self-pay

## 2020-12-25 ENCOUNTER — Encounter (HOSPITAL_BASED_OUTPATIENT_CLINIC_OR_DEPARTMENT_OTHER): Payer: Self-pay | Admitting: *Deleted

## 2020-12-25 DIAGNOSIS — R109 Unspecified abdominal pain: Secondary | ICD-10-CM

## 2020-12-25 DIAGNOSIS — F17210 Nicotine dependence, cigarettes, uncomplicated: Secondary | ICD-10-CM | POA: Diagnosis not present

## 2020-12-25 DIAGNOSIS — R1084 Generalized abdominal pain: Secondary | ICD-10-CM | POA: Insufficient documentation

## 2020-12-25 DIAGNOSIS — U071 COVID-19: Secondary | ICD-10-CM | POA: Insufficient documentation

## 2020-12-25 DIAGNOSIS — R197 Diarrhea, unspecified: Secondary | ICD-10-CM | POA: Diagnosis present

## 2020-12-25 LAB — COMPREHENSIVE METABOLIC PANEL
ALT: 31 U/L (ref 0–44)
AST: 32 U/L (ref 15–41)
Albumin: 4.4 g/dL (ref 3.5–5.0)
Alkaline Phosphatase: 63 U/L (ref 38–126)
Anion gap: 13 (ref 5–15)
BUN: 12 mg/dL (ref 6–20)
CO2: 20 mmol/L — ABNORMAL LOW (ref 22–32)
Calcium: 9.1 mg/dL (ref 8.9–10.3)
Chloride: 101 mmol/L (ref 98–111)
Creatinine, Ser: 0.98 mg/dL (ref 0.61–1.24)
GFR, Estimated: >60 mL/min (ref >60)
Glucose, Bld: 175 mg/dL — ABNORMAL HIGH (ref 70–99)
Potassium: 3.5 mmol/L (ref 3.5–5.1)
Sodium: 134 mmol/L — ABNORMAL LOW (ref 135–145)
Total Bilirubin: 0.3 mg/dL (ref 0.3–1.2)
Total Protein: 8 g/dL (ref 6.5–8.1)

## 2020-12-25 LAB — URINALYSIS, ROUTINE W REFLEX MICROSCOPIC
Bilirubin Urine: NEGATIVE
Glucose, UA: NEGATIVE mg/dL
Ketones, ur: NEGATIVE mg/dL
Leukocytes,Ua: NEGATIVE
Nitrite: NEGATIVE
Protein, ur: 100 mg/dL — AB
Specific Gravity, Urine: 1.03 (ref 1.005–1.030)
pH: 6 (ref 5.0–8.0)

## 2020-12-25 LAB — CBC
HCT: 48 % (ref 39.0–52.0)
Hemoglobin: 16.4 g/dL (ref 13.0–17.0)
MCH: 29.6 pg (ref 26.0–34.0)
MCHC: 34.2 g/dL (ref 30.0–36.0)
MCV: 86.6 fL (ref 80.0–100.0)
Platelets: 202 10*3/uL (ref 150–400)
RBC: 5.54 MIL/uL (ref 4.22–5.81)
RDW: 12.8 % (ref 11.5–15.5)
WBC: 11.1 10*3/uL — ABNORMAL HIGH (ref 4.0–10.5)
nRBC: 0 % (ref 0.0–0.2)

## 2020-12-25 LAB — URINALYSIS, MICROSCOPIC (REFLEX)

## 2020-12-25 LAB — SARS CORONAVIRUS 2 (TAT 6-24 HRS): SARS Coronavirus 2: POSITIVE — AB

## 2020-12-25 LAB — LIPASE, BLOOD: Lipase: 45 U/L (ref 11–51)

## 2020-12-25 MED ORDER — SODIUM CHLORIDE 0.9 % IV BOLUS
500.0000 mL | Freq: Once | INTRAVENOUS | Status: AC
  Administered 2020-12-25: 500 mL via INTRAVENOUS

## 2020-12-25 NOTE — ED Triage Notes (Signed)
Abdominal pain this am. Generalized pain in his abdomen. Pain is sharp and cramping. During the pain at times he gets diaphoretic and dizzy. No pain on arrival.

## 2020-12-25 NOTE — Discharge Instructions (Addendum)
You have been seen and discharged from the emergency department.  Follow-up with your primary provider and urologist for reevaluation.  We have sent a urine culture and COVID/flu swab.  Take home medications as prescribed. If you have any worsening symptoms or further concerns for health please return to an emergency department for further evaluation.

## 2020-12-25 NOTE — ED Provider Notes (Signed)
[facility] EMERGENCY DEPARTMENT Provider Note   CSN: [id number] Arrival date & time: 12/25/20  1241     History Chief Complaint  Patient presents with   Abdominal Pain    Bruce Blake is a 40 y.o. male.  HPI   39 year old male presents the emergency department after an episode of abdominal cramping and diarrhea.  Patient states earlier while he was filling out some paperwork to have a physical done for his work he felt flushed.  He went outside and said he started experiencing generalized abdominal cramping that resulted in an episode of diarrhea.  This then resolved.  He denies any chest pain or shortness of breath.  No nausea/vomiting but he has had decreased appetite over the past 2 days.  Denies any fever/chills.  No genitourinary symptoms.  Currently he is asymptomatic.  Past Medical History:  Diagnosis Date   Inguinal hernia    Prostate infection     There are no problems to display for this patient.   Past Surgical History:  Procedure Laterality Date   HERNIA REPAIR  2000       No family history on file.  Social History   Tobacco Use   Smoking status: Current Every Day Smoker    Packs/day: 0.50    Years: 0.00    Pack years: 0.00    Types: Cigarettes   Smokeless tobacco: Never Used  Building services engineer Use: Never used  Substance Use Topics   Alcohol use: Yes    Comment: occasionally   Drug use: Yes    Types: Marijuana    Home Medications Prior to Admission medications   Medication Sig Start Date End Date Taking? Authorizing Provider  predniSONE (DELTASONE) 10 MG tablet Two daily with food 01/13/18   Elvina Sidle, MD    Allergies    Patient has no known allergies.  Review of Systems   Review of Systems  Constitutional: Positive for appetite change. Negative for chills and fever.  HENT: Negative for congestion.   Eyes: Negative for visual disturbance.  Respiratory: Negative for shortness of breath.    Cardiovascular: Negative for chest pain.  Gastrointestinal: Positive for diarrhea. Negative for abdominal pain and vomiting.       +abdominal cramping  Genitourinary: Negative for dysuria.  Skin: Negative for rash.  Neurological: Negative for headaches.    Physical Exam Updated Vital Signs BP 130/81 (BP Location: Right Arm)    Pulse 86    Temp 99.2 F (37.3 C)    Resp 18    Ht 6\' 4"  (1.93 m)    Wt 125.8 kg    SpO2 100%    BMI 33.77 kg/m   Physical Exam Vitals and nursing note reviewed.  Constitutional:      Appearance: Normal appearance.  HENT:     Head: Normocephalic.     Mouth/Throat:     Mouth: Mucous membranes are moist.  Cardiovascular:     Rate and Rhythm: Normal rate.  Pulmonary:     Effort: Pulmonary effort is normal. No respiratory distress.  Abdominal:     Palpations: Abdomen is soft.     Tenderness: There is no abdominal tenderness. There is no guarding.  Skin:    General: Skin is warm.  Neurological:     Mental Status: He is alert and oriented to person, place, and time. Mental status is at baseline.  Psychiatric:        Mood and Affect: Mood normal.  ED Results / Procedures / Treatments   Labs (all labs ordered are listed, but only abnormal results are displayed) Labs Reviewed  COMPREHENSIVE METABOLIC PANEL - Abnormal; Notable for the following components:      Result Value   Sodium 134 (*)    CO2 20 (*)    Glucose, Bld 175 (*)    All other components within normal limits  CBC - Abnormal; Notable for the following components:   WBC 11.1 (*)    All other components within normal limits  URINALYSIS, ROUTINE W REFLEX MICROSCOPIC - Abnormal; Notable for the following components:   Color, Urine AMBER (*)    APPearance CLOUDY (*)    Hgb urine dipstick TRACE (*)    Protein, ur 100 (*)    All other components within normal limits  URINALYSIS, MICROSCOPIC (REFLEX) - Abnormal; Notable for the following components:   Bacteria, UA FEW (*)    All other  components within normal limits  SARS CORONAVIRUS 2 (TAT 6-24 HRS)  LIPASE, BLOOD    EKG None  Radiology No results found.  Procedures Procedures   Medications Ordered in ED Medications  sodium chloride 0.9 % bolus 500 mL (500 mLs Intravenous New Bag/Given 12/25/20 1630)    ED Course  I have reviewed the triage vital signs and the nursing notes.  Pertinent labs & imaging results that were available during my care of the patient were reviewed by me and considered in my medical decision making (see chart for details).    MDM Rules/Calculators/A&P                          Vitals are stable on arrival.  Patient is currently asymptomatic.  Benign comfortable abdominal exam.  Blood work is reassuring, mild leukocytosis of 11.1.  Low suspicion for any intra-abdominal infection/acute pathology.  No need for emergent imaging given that he is asymptomatic with a normal abdominal exam.  Patient's urine is concentrated with protein, few bacteria but no other signs of acute infection.  Patient mentions over 7 years ago he had a prostate infection.  Right now he has no genitourinary symptoms, rectal pain or any other complaints consistent with prostatitis.  He was supposed to follow-up with urology, I have encouraged for urology follow-up.  We will swab for COVID and send a urine culture.  I do not think the few bacteria is indication for antibiotic treatment especially with him being completely asymptomatic.  Patient is aware that the urine culture was sent, agrees to follow-up with urology.  Patient will be discharged and treated as an outpatient.  Discharge plan and strict return to ED precautions discussed, patient verbalizes understanding and agreement.  Final Clinical Impression(s) / ED Diagnoses Final diagnoses:  None    Rx / DC Orders ED Discharge Orders    None       Rozelle Logan, DO 12/25/20 3818

## 2020-12-26 ENCOUNTER — Telehealth: Payer: Self-pay

## 2020-12-26 NOTE — Telephone Encounter (Signed)
Called to discuss with patient about COVID-19 symptoms and the use of one of the available treatments for those with mild to moderate Covid symptoms and at a high risk of hospitalization.  Pt appears to qualify for outpatient treatment due to co-morbid conditions and/or a member of an at-risk group in accordance with the FDA Emergency Use Authorization.    Symptom onset: 12/25/20 Vaccinated: No Booster? No Immunocompromised? No Qualifiers: None  Pt. States no symptoms today.  Esther Hardy

## 2020-12-27 LAB — URINE CULTURE: Culture: 2000 — AB

## 2021-01-01 ENCOUNTER — Other Ambulatory Visit: Payer: Self-pay

## 2021-01-02 ENCOUNTER — Other Ambulatory Visit: Payer: Self-pay

## 2021-01-02 DIAGNOSIS — Z20822 Contact with and (suspected) exposure to covid-19: Secondary | ICD-10-CM

## 2021-01-03 LAB — NOVEL CORONAVIRUS, NAA: SARS-CoV-2, NAA: NOT DETECTED

## 2021-01-03 LAB — SARS-COV-2, NAA 2 DAY TAT

## 2021-07-30 ENCOUNTER — Emergency Department (HOSPITAL_BASED_OUTPATIENT_CLINIC_OR_DEPARTMENT_OTHER)
Admission: EM | Admit: 2021-07-30 | Discharge: 2021-07-30 | Disposition: A | Discharge status: Home / Self Care | Payer: Self-pay | Attending: Emergency Medicine | Admitting: Emergency Medicine

## 2021-07-30 ENCOUNTER — Telehealth (HOSPITAL_BASED_OUTPATIENT_CLINIC_OR_DEPARTMENT_OTHER): Payer: Self-pay | Admitting: Emergency Medicine

## 2021-07-30 ENCOUNTER — Emergency Department (HOSPITAL_BASED_OUTPATIENT_CLINIC_OR_DEPARTMENT_OTHER): Payer: Self-pay

## 2021-07-30 ENCOUNTER — Other Ambulatory Visit: Payer: Self-pay

## 2021-07-30 ENCOUNTER — Encounter (HOSPITAL_BASED_OUTPATIENT_CLINIC_OR_DEPARTMENT_OTHER): Payer: Self-pay

## 2021-07-30 DIAGNOSIS — F17210 Nicotine dependence, cigarettes, uncomplicated: Secondary | ICD-10-CM | POA: Insufficient documentation

## 2021-07-30 DIAGNOSIS — N201 Calculus of ureter: Secondary | ICD-10-CM | POA: Insufficient documentation

## 2021-07-30 DIAGNOSIS — R61 Generalized hyperhidrosis: Secondary | ICD-10-CM | POA: Insufficient documentation

## 2021-07-30 LAB — COMPREHENSIVE METABOLIC PANEL
ALT: 35 U/L (ref 0–44)
AST: 24 U/L (ref 15–41)
Albumin: 4.6 g/dL (ref 3.5–5.0)
Alkaline Phosphatase: 68 U/L (ref 38–126)
Anion gap: 10 (ref 5–15)
BUN: 16 mg/dL (ref 6–20)
CO2: 25 mmol/L (ref 22–32)
Calcium: 9.6 mg/dL (ref 8.9–10.3)
Chloride: 103 mmol/L (ref 98–111)
Creatinine, Ser: 1.13 mg/dL (ref 0.61–1.24)
GFR, Estimated: >60 mL/min (ref >60)
Glucose, Bld: 182 mg/dL — ABNORMAL HIGH (ref 70–99)
Potassium: 3.8 mmol/L (ref 3.5–5.1)
Sodium: 138 mmol/L (ref 135–145)
Total Bilirubin: 0.5 mg/dL (ref 0.3–1.2)
Total Protein: 7.3 g/dL (ref 6.5–8.1)

## 2021-07-30 LAB — CBC WITH DIFFERENTIAL/PLATELET
Abs Immature Granulocytes: 0.1 10*3/uL — ABNORMAL HIGH (ref 0.00–0.07)
Basophils Absolute: 0.1 10*3/uL (ref 0.0–0.1)
Basophils Relative: 0 %
Eosinophils Absolute: 0 10*3/uL (ref 0.0–0.5)
Eosinophils Relative: 0 %
HCT: 43.8 % (ref 39.0–52.0)
Hemoglobin: 15 g/dL (ref 13.0–17.0)
Immature Granulocytes: 1 %
Lymphocytes Relative: 14 %
Lymphs Abs: 2.2 10*3/uL (ref 0.7–4.0)
MCH: 30.1 pg (ref 26.0–34.0)
MCHC: 34.2 g/dL (ref 30.0–36.0)
MCV: 88 fL (ref 80.0–100.0)
Monocytes Absolute: 1.2 10*3/uL — ABNORMAL HIGH (ref 0.1–1.0)
Monocytes Relative: 7 %
Neutro Abs: 12.7 10*3/uL — ABNORMAL HIGH (ref 1.7–7.7)
Neutrophils Relative %: 78 %
Platelets: 226 10*3/uL (ref 150–400)
RBC: 4.98 MIL/uL (ref 4.22–5.81)
RDW: 12.9 % (ref 11.5–15.5)
WBC: 16.3 10*3/uL — ABNORMAL HIGH (ref 4.0–10.5)
nRBC: 0 % (ref 0.0–0.2)

## 2021-07-30 LAB — URINALYSIS, ROUTINE W REFLEX MICROSCOPIC
Bilirubin Urine: NEGATIVE
Glucose, UA: NEGATIVE mg/dL
Hgb urine dipstick: NEGATIVE
Ketones, ur: NEGATIVE mg/dL
Leukocytes,Ua: NEGATIVE
Nitrite: NEGATIVE
Protein, ur: NEGATIVE mg/dL
Specific Gravity, Urine: 1.03 (ref 1.005–1.030)
pH: 6 (ref 5.0–8.0)

## 2021-07-30 LAB — LIPASE, BLOOD: Lipase: 40 U/L (ref 11–51)

## 2021-07-30 MED ORDER — HYDROCODONE-ACETAMINOPHEN 5-325 MG PO TABS: 1.0000 | ORAL_TABLET | ORAL | 0 refills | Status: DC | PRN

## 2021-07-30 MED ORDER — ONDANSETRON 4 MG PO TBDP: 4.0000 mg | ORAL_TABLET | Freq: Three times a day (TID) | ORAL | 0 refills | Status: AC | PRN

## 2021-07-30 MED ORDER — SODIUM CHLORIDE 0.9 % IV BOLUS
500.0000 mL | Freq: Once | INTRAVENOUS | Status: AC
  Administered 2021-07-30: 500 mL via INTRAVENOUS

## 2021-07-30 MED ORDER — HYDROMORPHONE HCL 1 MG/ML IJ SOLN
1.0000 mg | Freq: Once | INTRAMUSCULAR | Status: AC
  Administered 2021-07-30: 1 mg via INTRAVENOUS
  Filled 2021-07-30: qty 1

## 2021-07-30 MED ORDER — KETOROLAC TROMETHAMINE 30 MG/ML IJ SOLN
30.0000 mg | Freq: Once | INTRAMUSCULAR | Status: AC
  Administered 2021-07-30: 30 mg via INTRAVENOUS
  Filled 2021-07-30: qty 1

## 2021-07-30 MED ORDER — ONDANSETRON HCL 4 MG/2ML IJ SOLN
4.0000 mg | Freq: Once | INTRAMUSCULAR | Status: AC
  Administered 2021-07-30: 4 mg via INTRAVENOUS
  Filled 2021-07-30: qty 2

## 2021-07-30 NOTE — Discharge Instructions (Addendum)
Take Norco as needed as prescribed for pain not controlled with ibuprofen. Take Zofran as needed as prescribed for nausea and vomiting. Not drive if taking Norco.  Norco can cause constipation, take Colace and MiraLAX as needed. Follow-up with urology, call today to schedule appointment. Strain all urine, if you catch your stone take it with you to your follow-up appointment. Return to the emergency room for fever, pain or vomiting not controlled with medications or other concerns.

## 2021-07-30 NOTE — ED Provider Notes (Signed)
[facility] EMERGENCY DEPARTMENT Provider Note   CSN: [id number] Arrival date & time: 07/30/21  6195     History Chief Complaint  Patient presents with   Testicle Pain    Bruce Blake is a 40 y.o. male.  40 year old male with past medical history of right inguinal hernia corrected surgically several years ago presents with complaint of right flank pain radiating to right testicle.  Patient reports last night, had intercourse and states that there was intense section on his testicles and he is concerned he may have return of his prior hernia.  In a monogamous relationship, no concerns for STDs.  Woke up this morning at 7 AM with a dull pain in his right flank which radiates to right testicle currently.  Pain is constant, aching in nature, worse with "stretching out."  Reports feeling clammy at times but denies nausea, vomiting.  Reports loose stools today, denies dysuria, frequency, hematuria.  Reports prior history of kidney stone and states this does feel similar to that.  No other complaints or concerns.      Past Medical History:  Diagnosis Date   Inguinal hernia    Prostate infection     There are no problems to display for this patient.   Past Surgical History:  Procedure Laterality Date   HERNIA REPAIR  2000       History reviewed. No pertinent family history.  Social History   Tobacco Use   Smoking status: Every Day    Packs/day: 0.50    Years: 0.00    Pack years: 0.00    Types: Cigarettes   Smokeless tobacco: Never  Vaping Use   Vaping Use: Never used  Substance Use Topics   Alcohol use: Yes    Comment: occasionally   Drug use: Not Currently    Types: Marijuana    Home Medications Prior to Admission medications   Medication Sig Start Date End Date Taking? Authorizing Provider  HYDROcodone-acetaminophen (NORCO/VICODIN) 5-325 MG tablet Take 1 tablet by mouth every 4 (four) hours as needed. 07/30/21  Yes Jeannie Fend, PA-C  ondansetron  (ZOFRAN ODT) 4 MG disintegrating tablet Take 1 tablet (4 mg total) by mouth every 8 (eight) hours as needed for nausea or vomiting. 07/30/21  Yes Jeannie Fend, PA-C  predniSONE (DELTASONE) 10 MG tablet Two daily with food 01/13/18   Elvina Sidle, MD    Allergies    Patient has no known allergies.  Review of Systems   Review of Systems  Constitutional:  Positive for diaphoresis. Negative for chills and fever.  Respiratory:  Negative for shortness of breath.   Cardiovascular:  Negative for chest pain.  Gastrointestinal:  Positive for abdominal pain and diarrhea. Negative for constipation, nausea and vomiting.  Genitourinary:  Positive for flank pain and testicular pain. Negative for difficulty urinating, dysuria, frequency, hematuria, penile discharge, penile pain, penile swelling, scrotal swelling and urgency.  Musculoskeletal:  Positive for back pain.  Skin:  Negative for rash and wound.  Allergic/Immunologic: Negative for immunocompromised state.  Neurological:  Negative for weakness and numbness.  Hematological:  Negative for adenopathy.  Psychiatric/Behavioral:  Negative for confusion.   All other systems reviewed and are negative.  Physical Exam Updated Vital Signs BP (!) 146/85   Pulse 66   Temp 98.8 F (37.1 C) (Oral)   Resp 18   SpO2 97%   Physical Exam Vitals and nursing note reviewed.  Constitutional:      General: He is not in acute  distress.    Appearance: He is well-developed. He is obese. He is diaphoretic.  HENT:     Head: Normocephalic and atraumatic.  Cardiovascular:     Rate and Rhythm: Normal rate and regular rhythm.     Heart sounds: Normal heart sounds.  Pulmonary:     Effort: Pulmonary effort is normal.     Breath sounds: Normal breath sounds.  Abdominal:     Palpations: Abdomen is soft.     Tenderness: There is abdominal tenderness in the right lower quadrant. There is no right CVA tenderness or left CVA tenderness.  Musculoskeletal:         General: No swelling or tenderness.  Skin:    General: Skin is warm.     Findings: No erythema or rash.  Neurological:     Mental Status: He is alert and oriented to person, place, and time.  Psychiatric:        Behavior: Behavior normal.    ED Results / Procedures / Treatments   Labs (all labs ordered are listed, but only abnormal results are displayed) Labs Reviewed  CBC WITH DIFFERENTIAL/PLATELET - Abnormal; Notable for the following components:      Result Value   WBC 16.3 (*)    Neutro Abs 12.7 (*)    Monocytes Absolute 1.2 (*)    Abs Immature Granulocytes 0.10 (*)    All other components within normal limits  COMPREHENSIVE METABOLIC PANEL - Abnormal; Notable for the following components:   Glucose, Bld 182 (*)    All other components within normal limits  URINALYSIS, ROUTINE W REFLEX MICROSCOPIC  LIPASE, BLOOD    EKG None  Radiology CT Renal Stone Study  Result Date: 07/30/2021 CLINICAL DATA:  Flank pain EXAM: CT ABDOMEN AND PELVIS WITHOUT CONTRAST TECHNIQUE: Multidetector CT imaging of the abdomen and pelvis was performed following the standard protocol without IV contrast. COMPARISON:  CT abdomen and pelvis dated October 31, 2017 FINDINGS: Lower chest: No acute abnormality. Hepatobiliary: No focal liver abnormality is seen. No gallstones, gallbladder wall thickening, or biliary dilatation. Pancreas: Unremarkable. Spleen: Unremarkable. Adrenals/Urinary Tract: The bilateral adrenal glands are unremarkable. Minimal right hydronephrosis with mild asymmetric fat stranding about the right kidney. Stone in the distal right ureter measuring 4 mm. Normal appearance of the left kidney. No nephrolithiasis. Bladder is decompressed. Stomach/Bowel: Stomach is within normal limits. Appendix appears normal. No evidence of bowel wall thickening, distention, or inflammatory changes. Vascular/Lymphatic: No aortic atherosclerosis. Mild calcified plaque of the iliac arteries. No enlarged  abdominal or pelvic lymph nodes. Reproductive: Prostate is unremarkable. Other: Small fat containing umbilical hernia. No abdominopelvic ascites. Musculoskeletal: No acute or significant osseous findings. IMPRESSION: Mild right hydronephrosis with stone the distal right ureter measuring 4 mm. Electronically Signed   By: Allegra Lai M.D.   On: 07/30/2021 11:12    Procedures Procedures   Medications Ordered in ED Medications  ondansetron (ZOFRAN) injection 4 mg (4 mg Intravenous Given 07/30/21 1028)  sodium chloride 0.9 % bolus 500 mL (0 mLs Intravenous Stopped 07/30/21 1159)  HYDROmorphone (DILAUDID) injection 1 mg (1 mg Intravenous Given 07/30/21 1027)  ketorolac (TORADOL) 30 MG/ML injection 30 mg (30 mg Intravenous Given 07/30/21 1130)    ED Course  I have reviewed the triage vital signs and the nursing notes.  Pertinent labs & imaging results that were available during my care of the patient were reviewed by me and considered in my medical decision making (see chart for details).  Clinical Course as of 07/30/21  9326  Thu Jul 30, 2021  6554 40 year old male with complaint of right flank pain as above.  On exam, appears uncomfortable, has mild tenderness in the right lower quadrant.  History of stones, pain is similar to prior stone, suspect kidney stone.  CT shows mild right hydronephrosis with stone in the distal right ureter measuring 4 mm.  Pain improved with Dilaudid however still had persistent pain and patient was given Toradol prior to discharge.  Review of labs, found to have a leukocytosis with white count of 16,000, urinalysis however negative for infection.  CMP with normal renal function.  Patient is mildly hypertensive otherwise he is afebrile with an O2 sat of 100% on room air. Patient is discharged with prescription for Norco and Zofran with urology follow-up and return to ER precautions. [LM]    Clinical Course User Index [LM] Alden Hipp   MDM  Rules/Calculators/A&P                           Final Clinical Impression(s) / ED Diagnoses Final diagnoses:  Ureterolithiasis    Rx / DC Orders ED Discharge Orders          Ordered    HYDROcodone-acetaminophen (NORCO/VICODIN) 5-325 MG tablet  Every 4 hours PRN        07/30/21 1211    ondansetron (ZOFRAN ODT) 4 MG disintegrating tablet  Every 8 hours PRN        07/30/21 1211             Jeannie Fend, PA-C 07/30/21 1214    Koleen Distance, MD 07/30/21 1517

## 2021-07-30 NOTE — ED Notes (Signed)
Discharge instructions discussed with pt. Pt verbalized understanding. Pt stable and ambulatory. Strainer provided

## 2021-07-30 NOTE — ED Triage Notes (Signed)
Pt states had intercourse last night. Woke up this morning with right sided back pain now having testicle pain. States had hernia surgery in testicles 20 years ago.

## 2023-02-06 ENCOUNTER — Emergency Department (HOSPITAL_BASED_OUTPATIENT_CLINIC_OR_DEPARTMENT_OTHER): Payer: Self-pay

## 2023-02-06 ENCOUNTER — Emergency Department (HOSPITAL_BASED_OUTPATIENT_CLINIC_OR_DEPARTMENT_OTHER)
Admission: EM | Admit: 2023-02-06 | Discharge: 2023-02-06 | Disposition: A | Discharge status: Home / Self Care | Payer: Self-pay | Attending: Emergency Medicine | Admitting: Emergency Medicine

## 2023-02-06 ENCOUNTER — Other Ambulatory Visit: Payer: Self-pay

## 2023-02-06 ENCOUNTER — Encounter (HOSPITAL_BASED_OUTPATIENT_CLINIC_OR_DEPARTMENT_OTHER): Payer: Self-pay | Admitting: Emergency Medicine

## 2023-02-06 DIAGNOSIS — S92514A Nondisplaced fracture of proximal phalanx of right lesser toe(s), initial encounter for closed fracture: Secondary | ICD-10-CM | POA: Insufficient documentation

## 2023-02-06 DIAGNOSIS — Y9301 Activity, walking, marching and hiking: Secondary | ICD-10-CM | POA: Insufficient documentation

## 2023-02-06 DIAGNOSIS — R030 Elevated blood-pressure reading, without diagnosis of hypertension: Secondary | ICD-10-CM | POA: Insufficient documentation

## 2023-02-06 DIAGNOSIS — W2203XA Walked into furniture, initial encounter: Secondary | ICD-10-CM | POA: Insufficient documentation

## 2023-02-06 HISTORY — DX: Essential (primary) hypertension: I10

## 2023-02-06 MED ORDER — IBUPROFEN 400 MG PO TABS
600.0000 mg | ORAL_TABLET | Freq: Once | ORAL | Status: AC
  Administered 2023-02-06: 600 mg via ORAL
  Filled 2023-02-06: qty 1

## 2023-02-06 MED ORDER — ACETAMINOPHEN 500 MG PO TABS
1000.0000 mg | ORAL_TABLET | Freq: Once | ORAL | Status: AC
  Administered 2023-02-06: 1000 mg via ORAL
  Filled 2023-02-06: qty 2

## 2023-02-06 MED ORDER — OXYCODONE HCL 5 MG PO TABS: 5.0000 mg | ORAL_TABLET | ORAL | 0 refills | Status: AC | PRN
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
## 2023-02-06 NOTE — ED Notes (Signed)
ED Provider at bedside. 

## 2023-02-06 NOTE — Discharge Instructions (Addendum)
You were seen in the ER today for evaluation of your toe pain.  You have fractured your toe.  You will need to have this buddy taped and wear the postop shoe to prevent worsening of the fracture.  I would like you to follow-up with an orthopedic provider, I have included wanted to this discharge paperwork for you to call to schedule an appointment with.  For pain, you can take at 1000 mg of Tylenol and/or 600 mg of ibuprofen every 6 hours as needed for pain.  I have prescribed you a few narcotic pain medications to take for breakthrough pain only.  Please do not drive or operate heavy machinery on this medication.  Additionally, you do have some elevated blood pressure.  I included information for a primary care doctor into this discharge paperwork.  Please make sure you are to call to schedule an appointment with them as well.  If you have any concerns, new or worsening symptoms, please return to the nearest emergency room for evaluation.  Contact a doctor if: Your pain medicine is not helping. You have a fever. You notice a bad smell coming from your cast. Get help right away if: You lose feeling (have numbness) in your toe or foot, and it is getting worse. Your toe or your foot tingles. Your toe or your foot gets cold or turns blue. You have redness or swelling in your toe or foot, and it is getting worse. You have very bad pain.

## 2023-02-06 NOTE — ED Provider Notes (Signed)
[facility] Provider Note   CSN: [id number] Arrival date & time: 02/06/23  1200     History Chief Complaint  Patient presents with   Toe Injury    Bruce Blake is a 42 y.o. male with diagnosis of hypertension however does not take any medication presents the emergency room today for evaluation of right toe pain.  Patient reports that this morning around 0500 he was getting ready for work and was walking around the dark when he hit his lateral 2 toes on the dresser.  He has been having significant pain since then.  Did not take the medication prior to arrival.  No wounds.  HPI     Home Medications Prior to Admission medications   Medication Sig Start Date End Date Taking? Authorizing Provider  oxyCODONE (ROXICODONE) 5 MG immediate release tablet Take 1 tablet (5 mg total) by mouth every 4 (four) hours as needed for severe pain. 02/06/23  Yes Sherrell Puller, PA-C  ondansetron (ZOFRAN ODT) 4 MG disintegrating tablet Take 1 tablet (4 mg total) by mouth every 8 (eight) hours as needed for nausea or vomiting. 07/30/21   Tacy Learn, PA-C  predniSONE (DELTASONE) 10 MG tablet Two daily with food 01/13/18   Robyn Haber, MD      Allergies    Patient has no known allergies.    Review of Systems   Review of Systems  Musculoskeletal:  Positive for arthralgias.    Physical Exam Updated Vital Signs BP (!) 148/99 (BP Location: Left Arm)   Pulse 85   Temp 98.6 F (37 C) (Oral)   Resp 16   Ht 6\' 4"  (1.93 m)   Wt (!) 140.6 kg   SpO2 100%   BMI 37.73 kg/m  Physical Exam Vitals and nursing note reviewed.  Constitutional:      General: He is not in acute distress.    Appearance: He is not ill-appearing or toxic-appearing.  Eyes:     General: No scleral icterus. Pulmonary:     Effort: Pulmonary effort is normal. No respiratory distress.  Musculoskeletal:     Comments: Bruising noted to the base of the fourth toe going into the  more medial base of the fifth toe.  Cap refill still brisk.  Tenderness to palpation at the base of these toes and into the fourth and fifth toe as well.  No tenderness to the plantar aspect of the foot or to the heel.  Flexion extension of the heel present.  He is palpable DP and PT pulses.  Compartments are soft.  Sensation intact.  Skin:    General: Skin is dry.     Findings: No rash.  Neurological:     General: No focal deficit present.     Mental Status: He is alert. Mental status is at baseline.  Psychiatric:        Mood and Affect: Mood normal.     ED Results / Procedures / Treatments   Labs (all labs ordered are listed, but only abnormal results are displayed) Labs Reviewed - No data to display  EKG None  Radiology DG Foot Complete Right  Result Date: 02/06/2023 CLINICAL DATA:  pain at the fourth and fifth toe after hitting on dresser EXAM: RIGHT FOOT COMPLETE - 3+ VIEW COMPARISON:  None Available. FINDINGS: There is a nondisplaced fracture of the fourth toe proximal phalanx. Adjacent soft tissue swelling. IMPRESSION: Nondisplaced fracture of the fourth toe proximal phalanx. Electronically Signed  By: Maurine Simmering M.D.   On: 02/06/2023 12:51    Procedures Procedures   Medications Ordered in ED Medications - No data to display  ED Course/ Medical Decision Making/ A&P   Medical Decision Making Amount and/or Complexity of Data Reviewed Radiology: ordered.  Risk Prescription drug management.   42 y.o. male presents to the ER today for evaluation of right toe pain after hitting a dresser. Differential diagnosis includes but is not limited to contusion, fracture, dislocation. Vital signs show slightly elevated blood pressure otherwise afebrile, normal pulse rate, satting well room air without increased work of breathing. Physical exam as noted above.   X-ray imaging shows nondisplaced fracture of the fourth toe proximal phalanx.   Given this, I did place the patient  with buddy taping and a postop shoe.  Compartments are soft, sensations intact, with palpable pulses and brisk cap refill.  Will give him a work note and have him follow-up with orthopedics.  Additionally, included some information for primary care doctor into his discharge report from the call to schedule appointment for his elevated blood pressure.  Given that this fracture is nondisplaced, does not need any reduction.  He is stable for discharge home.  We discussed plan at bedside. We discussed strict return precautions and red flag symptoms. The patient verbalized their understanding and agrees to the plan. The patient is stable and being discharged home in good condition.  Portions of this report may have been transcribed using voice recognition software. Every effort was made to ensure accuracy; however, inadvertent computerized transcription errors may be present.   Final Clinical Impression(s) / ED Diagnoses Final diagnoses:  Elevated blood pressure reading  Closed nondisplaced fracture of proximal phalanx of lesser toe of right foot, initial encounter    Rx / DC Orders ED Discharge Orders          Ordered    oxyCODONE (ROXICODONE) 5 MG immediate release tablet  Every 4 hours PRN        02/06/23 1312              Sherrell Puller, [place] 02/06/23 1315    Sherwood Gambler, MD 02/06/23 1527

## 2023-02-06 NOTE — ED Triage Notes (Signed)
Patient reports hit right toe on edge of dresser this morning.

## 2024-02-05 DIAGNOSIS — S29012A Strain of muscle and tendon of back wall of thorax, initial encounter: Secondary | ICD-10-CM | POA: Diagnosis not present

## 2024-02-05 DIAGNOSIS — S43401A Unspecified sprain of right shoulder joint, initial encounter: Secondary | ICD-10-CM | POA: Insufficient documentation

## 2024-02-05 DIAGNOSIS — S134XXA Sprain of ligaments of cervical spine, initial encounter: Secondary | ICD-10-CM | POA: Insufficient documentation

## 2024-02-05 DIAGNOSIS — I10 Essential (primary) hypertension: Secondary | ICD-10-CM | POA: Diagnosis not present

## 2024-02-05 DIAGNOSIS — S199XXA Unspecified injury of neck, initial encounter: Secondary | ICD-10-CM | POA: Diagnosis present

## 2024-02-05 DIAGNOSIS — Y92410 Unspecified street and highway as the place of occurrence of the external cause: Secondary | ICD-10-CM | POA: Insufficient documentation
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
## 2024-02-06 ENCOUNTER — Emergency Department (HOSPITAL_BASED_OUTPATIENT_CLINIC_OR_DEPARTMENT_OTHER)

## 2024-02-06 ENCOUNTER — Emergency Department (HOSPITAL_BASED_OUTPATIENT_CLINIC_OR_DEPARTMENT_OTHER)
Admission: EM | Admit: 2024-02-06 | Discharge: 2024-02-06 | Disposition: A | Discharge status: Home / Self Care | Attending: Emergency Medicine | Admitting: Emergency Medicine

## 2024-02-06 ENCOUNTER — Encounter (HOSPITAL_BASED_OUTPATIENT_CLINIC_OR_DEPARTMENT_OTHER): Payer: Self-pay | Admitting: Emergency Medicine

## 2024-02-06 DIAGNOSIS — S43401A Unspecified sprain of right shoulder joint, initial encounter: Secondary | ICD-10-CM

## 2024-02-06 DIAGNOSIS — S29019A Strain of muscle and tendon of unspecified wall of thorax, initial encounter: Secondary | ICD-10-CM

## 2024-02-06 DIAGNOSIS — S139XXA Sprain of joints and ligaments of unspecified parts of neck, initial encounter: Secondary | ICD-10-CM

## 2024-02-06 DIAGNOSIS — S134XXA Sprain of ligaments of cervical spine, initial encounter: Secondary | ICD-10-CM | POA: Diagnosis not present

## 2024-02-06 MED ORDER — PREDNISONE 50 MG PO TABS
60.0000 mg | ORAL_TABLET | Freq: Once | ORAL | Status: AC
  Administered 2024-02-06: 60 mg via ORAL
  Filled 2024-02-06: qty 1

## 2024-02-06 MED ORDER — NAPROXEN 250 MG PO TABS: 500.0000 mg | ORAL_TABLET | Freq: Once | ORAL | Status: DC

## 2024-02-06 MED ORDER — NAPROXEN 500 MG PO TABS: 500.0000 mg | ORAL_TABLET | Freq: Two times a day (BID) | ORAL | 0 refills | Status: AC

## 2024-02-06 MED ORDER — PREDNISONE 50 MG PO TABS: 50.0000 mg | ORAL_TABLET | Freq: Every day | ORAL | 0 refills | Status: AC

## 2024-02-06 MED ORDER — IBUPROFEN 400 MG PO TABS
600.0000 mg | ORAL_TABLET | Freq: Once | ORAL | Status: AC
  Administered 2024-02-06: 600 mg via ORAL
  Filled 2024-02-06: qty 1
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
## 2024-02-06 NOTE — Discharge Instructions (Addendum)
 Apply ice to sore areas.  Ice should be applied for 30 minutes at a time, 4 times a day.  In addition to the medications which you have been prescribed, you may take acetaminophen (Tylenol) as needed for additional pain relief.

## 2024-12-09 ENCOUNTER — Emergency Department

## 2024-12-09 ENCOUNTER — Emergency Department: Admission: EM | Admit: 2024-12-09 | Discharge: 2024-12-09 | Disposition: A | Discharge status: Home / Self Care

## 2024-12-09 DIAGNOSIS — K8050 Calculus of bile duct without cholangitis or cholecystitis without obstruction: Secondary | ICD-10-CM | POA: Insufficient documentation

## 2024-12-09 DIAGNOSIS — R739 Hyperglycemia, unspecified: Secondary | ICD-10-CM

## 2024-12-09 DIAGNOSIS — R1011 Right upper quadrant pain: Secondary | ICD-10-CM | POA: Diagnosis present

## 2024-12-09 HISTORY — DX: Gastro-esophageal reflux disease without esophagitis: K21.9

## 2024-12-09 LAB — COMPREHENSIVE METABOLIC PANEL WITH GFR
ALT: 32 U/L (ref 0–44)
AST: 21 U/L (ref 15–41)
Albumin: 4.5 g/dL (ref 3.5–5.0)
Alkaline Phosphatase: 106 U/L (ref 38–126)
Anion gap: 12 (ref 5–15)
BUN: 14 mg/dL (ref 6–20)
CO2: 23 mmol/L (ref 22–32)
Calcium: 9.8 mg/dL (ref 8.9–10.3)
Chloride: 100 mmol/L (ref 98–111)
Creatinine, Ser: 0.83 mg/dL (ref 0.61–1.24)
GFR, Estimated: >60 mL/min
Glucose, Bld: 359 mg/dL — ABNORMAL HIGH (ref 70–99)
Potassium: 4 mmol/L (ref 3.5–5.1)
Sodium: 136 mmol/L (ref 135–145)
Total Bilirubin: 0.4 mg/dL (ref 0.0–1.2)
Total Protein: 7.6 g/dL (ref 6.5–8.1)

## 2024-12-09 LAB — CBC
HCT: 44.4 % (ref 39.0–52.0)
Hemoglobin: 14.8 g/dL (ref 13.0–17.0)
MCH: 29 pg (ref 26.0–34.0)
MCHC: 33.3 g/dL (ref 30.0–36.0)
MCV: 87.1 fL (ref 80.0–100.0)
Platelets: 246 K/uL (ref 150–400)
RBC: 5.1 MIL/uL (ref 4.22–5.81)
RDW: 12.2 % (ref 11.5–15.5)
WBC: 12.2 K/uL — ABNORMAL HIGH (ref 4.0–10.5)
nRBC: 0 % (ref 0.0–0.2)

## 2024-12-09 LAB — HEMOGLOBIN A1C
Hgb A1c MFr Bld: 12.1 % — ABNORMAL HIGH (ref 4.8–5.6)
Mean Plasma Glucose: 300.57 mg/dL

## 2024-12-09 LAB — LIPASE, BLOOD: Lipase: 67 U/L — ABNORMAL HIGH (ref 11–51)

## 2024-12-09 MED ORDER — OXYCODONE HCL 5 MG PO TABS: 5.0000 mg | ORAL_TABLET | Freq: Three times a day (TID) | ORAL | 0 refills | Status: AC | PRN

## 2024-12-09 MED ORDER — MORPHINE SULFATE (PF) 4 MG/ML IV SOLN
4.0000 mg | Freq: Once | INTRAVENOUS | Status: AC
  Administered 2024-12-09: 4 mg via INTRAVENOUS
  Filled 2024-12-09: qty 1

## 2024-12-09 MED ORDER — METFORMIN HCL 500 MG PO TABS: 500.0000 mg | ORAL_TABLET | Freq: Every day | ORAL | 2 refills | Status: AC

## 2024-12-09 MED ORDER — ONDANSETRON 4 MG PO TBDP: 4.0000 mg | ORAL_TABLET | Freq: Three times a day (TID) | ORAL | 0 refills | Status: AC | PRN

## 2024-12-09 MED ORDER — SODIUM CHLORIDE 0.9 % IV BOLUS
1000.0000 mL | Freq: Once | INTRAVENOUS | Status: AC
  Administered 2024-12-09: 1000 mL via INTRAVENOUS

## 2024-12-09 MED ORDER — KETOROLAC TROMETHAMINE 30 MG/ML IJ SOLN
15.0000 mg | Freq: Once | INTRAMUSCULAR | Status: AC
  Administered 2024-12-09: 15 mg via INTRAVENOUS
  Filled 2024-12-09: qty 1

## 2024-12-09 MED ORDER — ONDANSETRON HCL 4 MG/2ML IJ SOLN
4.0000 mg | Freq: Once | INTRAMUSCULAR | Status: AC
  Administered 2024-12-09: 4 mg via INTRAVENOUS
  Filled 2024-12-09: qty 2

## 2024-12-09 NOTE — ED Provider Notes (Signed)
 "  [facility] Provider Note    Event Date/Time   First MD Initiated Contact with Patient 12/09/24 0543     (approximate)   History   Abdominal Pain   HPI  Bruce Blake is a 44 y.o. male presenting with concern of upper abdominal pain.  Was at work around 1 AM developed some slight upper abdominal pain, worsened throughout the night, he got home, had a bowel movement, pain became significantly increased, limited primarily to the right upper quadrant in the epigastrium, some mild affiliated nausea.  Pain seems to radiate to his back.  Denies associated fevers but does have a sensation of some chills.  No associated urinary symptoms, history of inguinal hernia repair but no other surgical history.  Does endorse high blood pressure history but no other medical history.  EMS found his glucose to be in the 300s denies any known history of diabetes.  Does endorse to tobacco use, occasional alcohol use last drink at [facility], denies any illicit substance use.     Physical Exam   Triage Vital Signs: ED Triage Vitals  Encounter Vitals Group     BP --      Girls Systolic BP Percentile --      Girls Diastolic BP Percentile --      Boys Systolic BP Percentile --      Boys Diastolic BP Percentile --      Pulse --      Resp --      Temp --      Temp src --      SpO2 --      Weight 12/09/24 0540 291 lb 7.2 oz (132.2 kg)     Height 12/09/24 0540 6' 4 (1.93 m)     Head Circumference --      Peak Flow --      Pain Score 12/09/24 0539 10     Pain Loc --      Pain Education --      Exclude from Growth Chart --     Most recent vital signs: Vitals:   12/09/24 0546 12/09/24 0600  BP:  (!) 158/94  Pulse: 66 65  Resp: 19   Temp: 98.6 F (37 C)   SpO2:  100%     General: Awake, no distress.  CV:  Good peripheral perfusion.  Resp:  Normal effort.  Clear to auscultation bilaterally Abd:  No distention.  Soft with discomfort to palpation along the right  upper quadrant with positive Murphy sign Other:     ED Results / Procedures / Treatments   Labs (all labs ordered are listed, but only abnormal results are displayed) Labs Reviewed  CBC - Abnormal; Notable for the following components:      Result Value   WBC 12.2 (*)    All other components within normal limits  COMPREHENSIVE METABOLIC PANEL WITH GFR - Abnormal; Notable for the following components:   Glucose, Bld 359 (*)    All other components within normal limits  LIPASE, BLOOD - Abnormal; Notable for the following components:   Lipase 67 (*)    All other components within normal limits     EKG     RADIOLOGY   PROCEDURES:  Critical Care performed: No  Procedures   MEDICATIONS ORDERED IN ED: Medications  morphine  (PF) 4 MG/ML injection 4 mg (4 mg Intravenous Given 12/09/24 0550)  ondansetron  (ZOFRAN ) injection 4 mg (4 mg Intravenous Given 12/09/24 0550)  sodium  chloride 0.9 % bolus 1,000 mL (1,000 mLs Intravenous New Bag/Given 12/09/24 0550)  morphine  (PF) 4 MG/ML injection 4 mg (4 mg Intravenous Given 12/09/24 0655)     IMPRESSION / MDM / ASSESSMENT AND PLAN / ED COURSE  I reviewed the triage vital signs and the nursing notes.                               Patient's presentation is most consistent with acute presentation with potential threat to life or bodily function.  44 year old male who presents today with concern of right upper quadrant abdominal pain.  His vitals here are fortunately reassuring however he has significant discomfort with palpation along the right upper quadrant of the abdomen concerning for biliary pathology versus possible pancreatic pathology amongst other etiologies.  Will attempt symptomatic management, obtain ultrasound imaging follow-up labs and determine appropriate disposition accordingly.   Clinical Course as of 12/09/24 0703  [name] Dec 09, 2024  9360 Patient appears fairly drowsy but continues to complain of pain along his  right upper quadrant, awaiting results of the ultrasound, I reviewed them myself independently, it does seem that he has a gallstone but no evidence of acute cholecystitis [SK]  0649 Ultrasound does not demonstrate evidence of cholecystitis some questionable CBD dilatation.  Given the patient's persistent symptoms of pain, will give him a repeat dose of pain medication here and reassess. [SK]    Clinical Course User Index [SK] Fernand Rossie HERO, MD     FINAL CLINICAL IMPRESSION(S) / ED DIAGNOSES   Final diagnoses:  Biliary colic     Rx / DC Orders   ED Discharge Orders     None        Note:  This document was prepared using Dragon voice recognition software and may include unintentional dictation errors.   Fernand Rossie HERO, MD 12/09/24 [phone]  "

## 2024-12-09 NOTE — ED Triage Notes (Signed)
 Pt here from home via EMS for sudden epigastric pain while at work eating some chips. Pt notes hx of HTN, and inguinal hernia repair. Denies V/D/fever/CP/SOB, but endorses some N. Pt appears uncomfortable. Took 800 mg ibuprofen  @ 0430. BG in the 300's without hx of DM. Notable pain with palpation upon exhalation in RUQ pain with MD.

## 2024-12-09 NOTE — Discharge Instructions (Addendum)
 Call the surgeon to see them in clinic and discuss gallstone possible removal of gallbladder  You were referred to a PCP, so you should get a phone call to schedule an appointment with them  Please take Tylenol  and ibuprofen /Advil  for your pain.  It is safe to take them together, or to alternate them every few hours.  Take up to 1000mg  of Tylenol  at a time, up to 4 times per day.  Do not take more than 4000 mg of Tylenol  in 24 hours.  For ibuprofen , take 400-600 mg, 3 - 4 times per day.  Zofran  as needed for nausea and vomiting  Oxycodone  as needed for more severe/breakthrough pain.  Do not work, drive, or operate machinery with this  Metformin  once daily every day to help with high sugars/likely diabetes.  We did send a hemoglobin A1c test and it is in process, we could follow-up on these results on your MyChart.  Return to the ED with any worsening symptoms

## 2024-12-09 NOTE — ED Provider Notes (Signed)
 Clinical Course as of 12/09/24 1044  Sun Dec 09, 2024  9360 Patient appears fairly drowsy but continues to complain of pain along his right upper quadrant, awaiting results of the ultrasound, I reviewed them myself independently, it does seem that he has a gallstone but no evidence of acute cholecystitis [SK]  0649 Ultrasound does not demonstrate evidence of cholecystitis some questionable CBD dilatation.  Given the patient's persistent symptoms of pain, will give him a repeat dose of pain medication here and reassess. [SK]  [phone] Introduced myself and assessed the patient.  We discussed gallstones, normal labs and plan of care, possible outcomes.  After second round of morphine  his pain is improved and 4/10 intensity.  He is asking about something to drink.  We discussed Toradol  and p.o. challenge with some fluids and reassessing. [DS]  0930 Reassessed, comfortably asleep [DS]  1038 Reassessed and discussed to biliary colic, his pain is controlled,  he is sitting upright snacking on crackers and drinking water reports feeling well and is appreciative.  We discussed biliary colic, gallstones and managing symptoms as an outpatient, following up with surgery.  We discussed hyperglycemia and presumptive diagnosis of diabetes, PCP referral, pending A1c, starting on metformin .  Answered questions. [DS]    Clinical Course User Index [DS] Claudene Rover, MD [SK] Fernand Rossie HERO, MD      Claudene Rover, MD 12/09/24 [phone]
